# Patient Record
Sex: Female | Born: 1985 | Race: White | Hispanic: No | Marital: Married | Smoking: Current every day smoker
Health system: Southern US, Community
[De-identification: ages and names within clinical notes are randomized; demographics above are authoritative.]

## PROBLEM LIST (undated history)

## (undated) ENCOUNTER — Emergency Department (HOSPITAL_BASED_OUTPATIENT_CLINIC_OR_DEPARTMENT_OTHER): Discharge status: Absent without leave | Payer: Medicaid Other

## (undated) DIAGNOSIS — F419 Anxiety disorder, unspecified: Secondary | ICD-10-CM

## (undated) DIAGNOSIS — E119 Type 2 diabetes mellitus without complications: Secondary | ICD-10-CM

## (undated) DIAGNOSIS — K219 Gastro-esophageal reflux disease without esophagitis: Secondary | ICD-10-CM

## (undated) HISTORY — PX: TUBAL LIGATION: SHX77

## (undated) HISTORY — DX: Anxiety disorder, unspecified: F41.9

## (undated) HISTORY — DX: Gastro-esophageal reflux disease without esophagitis: K21.9

---

## 2003-08-14 ENCOUNTER — Ambulatory Visit (HOSPITAL_COMMUNITY): Admission: AD | Admit: 2003-08-14 | Discharge: 2003-08-14 | Payer: Self-pay | Admitting: Obstetrics and Gynecology

## 2003-09-03 ENCOUNTER — Ambulatory Visit (HOSPITAL_COMMUNITY): Admission: AD | Admit: 2003-09-03 | Discharge: 2003-09-03 | Payer: Self-pay | Admitting: Obstetrics and Gynecology

## 2003-10-05 ENCOUNTER — Ambulatory Visit (HOSPITAL_COMMUNITY): Admission: AD | Admit: 2003-10-05 | Discharge: 2003-10-05 | Payer: Self-pay | Admitting: Obstetrics & Gynecology

## 2003-10-24 ENCOUNTER — Ambulatory Visit (HOSPITAL_COMMUNITY): Admission: AD | Admit: 2003-10-24 | Discharge: 2003-10-24 | Payer: Self-pay | Admitting: Obstetrics & Gynecology

## 2003-10-29 ENCOUNTER — Inpatient Hospital Stay (HOSPITAL_COMMUNITY): Admission: AD | Admit: 2003-10-29 | Discharge: 2003-11-02 | Payer: Self-pay | Admitting: Obstetrics and Gynecology

## 2004-12-23 ENCOUNTER — Inpatient Hospital Stay (HOSPITAL_COMMUNITY): Admission: RE | Admit: 2004-12-23 | Discharge: 2004-12-26 | Payer: Self-pay | Admitting: Obstetrics and Gynecology

## 2008-09-23 ENCOUNTER — Other Ambulatory Visit: Admission: RE | Admit: 2008-09-23 | Discharge: 2008-09-23 | Payer: Self-pay | Admitting: Obstetrics and Gynecology

## 2009-07-07 ENCOUNTER — Other Ambulatory Visit: Admission: RE | Admit: 2009-07-07 | Discharge: 2009-07-07 | Payer: Self-pay | Admitting: Obstetrics and Gynecology

## 2009-12-23 ENCOUNTER — Other Ambulatory Visit: Admission: RE | Admit: 2009-12-23 | Discharge: 2009-12-23 | Payer: Self-pay | Admitting: Obstetrics and Gynecology

## 2010-05-17 ENCOUNTER — Ambulatory Visit (HOSPITAL_COMMUNITY): Admission: RE | Admit: 2010-05-17 | Discharge: 2010-05-17 | Payer: Self-pay | Admitting: Obstetrics & Gynecology

## 2010-07-01 ENCOUNTER — Inpatient Hospital Stay (HOSPITAL_COMMUNITY): Admission: RE | Admit: 2010-07-01 | Discharge: 2010-07-03 | Payer: Self-pay | Admitting: Obstetrics and Gynecology

## 2011-01-05 LAB — TYPE AND SCREEN: ABO/RH(D): A POS

## 2011-01-05 LAB — GLUCOSE, CAPILLARY: Glucose-Capillary: 96 mg/dL (ref 70–99)

## 2011-01-05 LAB — CBC
HCT: 26.9 % — ABNORMAL LOW (ref 36.0–46.0)
Hemoglobin: 11.1 g/dL — ABNORMAL LOW (ref 12.0–15.0)
MCH: 27.9 pg (ref 26.0–34.0)
MCV: 82.3 fL (ref 78.0–100.0)
MCV: 83 fL (ref 78.0–100.0)
Platelets: 158 10*3/uL (ref 150–400)
Platelets: 206 10*3/uL (ref 150–400)
RBC: 3.98 MIL/uL (ref 3.87–5.11)
RDW: 14.8 % (ref 11.5–15.5)

## 2011-01-05 LAB — ABO/RH: ABO/RH(D): A POS

## 2011-01-05 LAB — RPR: RPR Ser Ql: NONREACTIVE

## 2011-03-10 NOTE — H&P (Signed)
NAME:  Carol Escobar, Carol Escobar                        ACCOUNT NO.:  [id number]   MEDICAL RECORD NO.:  [id number]                   PATIENT TYPE:  INP   LOCATION:  LDR1                                 FACILITY:  [facility]   PHYSICIAN:  Tilda Burrow, M.D.              DATE OF BIRTH:  [birth year]   DATE OF ADMISSION:  10/29/2003  DATE OF DISCHARGE:                                HISTORY & PHYSICAL   ADMISSION DIAGNOSIS:  This is a 38-1/2-weeks gestation, elective induction  at the patient's request.   HISTORY OF PRESENT ILLNESS:  This is an 25 year old female, gravida 2, para  0, ab 1, due November 10, 2003 by consensus criteria, followed through our  office through pregnancy with straight forward pregnancy course to date,  with initial visit Mar 05, 2003 and recent cervical changes noted on exams  as far as back as October 12, 2003, is admitted for induction of labor.  Daneesha has been vigorously requesting induction for some time.  Had agreed  earlier to delay until this time.  We have explained in lengthy  conversations the pros and the cons of elective induction with specific  review of potential complications including the small but unavoidable risk  of fetal lung immaturity at 38-1/[redacted] weeks gestation.  The patient has had  multiple opportunities to ask questions and is comfortable proceeding at  this time, acknowledging these inherent risks.  She is aware that all the  usual potential complications of labor can occur with induced deliveries  including labor complications, fetal distress, or need for emergency  interventions.   Koraima indications for requested induction is her complaints of marked  pelvic pressure and discomfort which she finds incapacitating.  Contractions  have been described as intermittent.  Cervical exam was 2-3 cm, 50%, -2 at  the October 12, 2003 exam, confirmed at December 27 and exam tonight at the  time of admission.  The cervix remains 2 cm, 50%, -2, vertex  presentation.  Estimated fetal weight is 7-1/2 pounds.   PAST MEDICAL HISTORY:  Benign.   PAST SURGICAL HISTORY:  Negative.   She reports the pregnancy was a contraceptive failure.  Early first  trimester ultrasounds agree with the menstrual EDC.  Late ultrasound  suggests an earlier [facility] indicating good fetal growth.   ALLERGIES:  CODEINE which causes only nausea and vomiting.   SOCIAL HISTORY:  Single.  Works at [employer].  Lives with her  mother.   OB HISTORY:  Prior OB history is a 16-week IUFD, managed at [facility].   PHYSICAL EXAMINATION:  GENERAL:  Healthy, alert, oriented, Caucasian female  who appears her stated age.  VITAL SIGNS:  Weight 230.  Vital signs stable.  ABDOMEN:  Term size fetus.  Estimated fetal weight 8 pounds.  Cervix 2, 50%,  -2, vertex, palpated.   PLAN:  Cytotec 25 mcg tablets q.4h. up to three doses to establish uterine  contractions.  Pitocin induction in the a.m.  The patient not laboring in  response to Cytotec.   PRENATAL LABS:  Blood type A positive.  Urine drug screen negative.  Hemoglobin 13, hematocrit 39.  Hepatitis, HIV and GC/Chlamydia, RPR are all  negative.  Sickle Dex is negative.  MSA obtained normally 10/7898.     ___________________________________________                                         Tilda Burrow, M.D.   JVF/MEDQ  D:  10/29/2003  T:  10/29/2003  Job:  [id number]   cc:   Francoise Schaumann. Halm, D.O.  [address]  [place]  [place] [address]  Fax: [phone]

## 2011-03-10 NOTE — Op Note (Signed)
NAMEREIGHN, KAPLAN              ACCOUNT NO.:  [id number]   MEDICAL RECORD NO.:  [id number]          PATIENT TYPE:  AMB   LOCATION:  DAY                           FACILITY:  [facility]   PHYSICIAN:  Tilda Burrow, M.D. DATE OF BIRTH:  January 02, 1986   DATE OF PROCEDURE:  12/23/2004  DATE OF DISCHARGE:                                 OPERATIVE REPORT   PREOPERATIVE DIAGNOSIS:  Pregnancy, 38 weeks 6 days. Prior Cesarean section,  now for trial of labor. Abdominal wall laxity. Poorly healed scar.   POSTOPERATIVE DIAGNOSIS:  Pregnancy, 38 weeks 6 days. Prior Cesarean  section, now for trial of labor. Abdominal wall laxity. Poorly healed scar.  Delivered.   PROCEDURE:  Repeat low transverse Cesarean section, wide excision of old  cicatrix.   SURGEON:  Dr. Emelda Fear.   ASSISTANT:  _________________ .   ANESTHESIA:  Spinal.   FINDINGS:  Healthy female infant. Apgars 9 and 9.   DETAILS OF PROCEDURE:  The patient was taken to the operating room and  prepped and draped for low abdominal procedure with Foley catheter in place  and spinal anesthesia tested for efficacy. The old cicatrix was incised with  two inches of skin and underlying fatty tissue. We then proceeded to enter  the abdominal cavity with a transverse lower abdominal incision in the  midline, opening the peritoneal cavity. Bladder flap was relatively high on  the anterior abdominal wall but was avoided. The bladder flap was developed  on the lower uterine segment. Transverse uterine incision made and extended  laterally using index finger traction and fundal pressure used to guide the  vertex through the incision. The patient delivered a healthy female infant.  Apgars 9 and 9. Cared for by Dr. Milinda Cave; see his note dictated elsewhere.  The patient then proceeded to have uterine irrigation. There was lots of  bleeding from the uterine bed after the placenta was delivered, and we  injected 250 mcg of Hemabate into the myometrium to  reduce this continued  oozing, and pressure on the uterus for 3 to 5 minutes was performed.  Hemostasis was much better then, and we proceeded with the lower uterine  segment incision closure with running locking 2-0 chromic. IV Ancef was  administered. Bladder flap was reapproximated using running 2-0 chromic. The  anterior peritoneum closed with a running 2-0 chromic as well. Fascia closed  with 0 Vicryl. Subcu fatty tissues closed with interrupted 2-0 plain, and  staple closure of the skin completed the procedure with a Jackson-Pratt  drain placed in the subcu fatty space.   ADDENDUM:  The patient showed some increased bleeding in the recovery room  and will be monitored for continued blood loss.      JVF/MEDQ  D:  12/23/2004  T:  12/23/2004  Job:  [id number]   cc:   Jeoffrey Massed, MD  [address]  [place]  [place] [address]  Fax: [phone]

## 2011-03-10 NOTE — Op Note (Signed)
NAME:  Carol Escobar, Carol Escobar                        ACCOUNT NO.:  [id number]   MEDICAL RECORD NO.:  [id number]                   PATIENT TYPE:  INP   LOCATION:  A417                                 FACILITY:  [facility]   PHYSICIAN:  Tilda Burrow, M.D.              DATE OF BIRTH:  17-Oct-1986   DATE OF PROCEDURE:  DATE OF DISCHARGE:                                 OPERATIVE REPORT   PREOPERATIVE DIAGNOSIS:  Pregnancy at 39 weeks, medical induction of labor  (elective), uncertain fetal status.   POSTOPERATIVE DIAGNOSIS:  Pregnancy at 39 weeks, medical induction of labor  (elective), uncertain fetal status, delivered.   PROCEDURE:  Primary low transverse cervical cesarean section.   SURGEON:  Tilda Burrow, M.D.   ASSISTANTAmie Critchley, C.S.T., Small, R.N.   ANESTHESIA:  Spinal.   COMPLICATIONS:  None.   FINDINGS:  Healthy female, Apgars 7 and 8, clear amniotic fluid, no malodor,  no evidence of nuchal cord, normal-appearing posterior placenta.   INDICATIONS FOR PROCEDURE:  An 25 year old female gravida 2, para 0 induced  as per patient request with Cytotec preinduction the night before and then  Pitocin begun at 4 a.m.  By 8 a.m., it was obvious that the patient was  developing repetitive deceleration pattern that was nonreassuring for fetal  wellbeing.  Pitocin was discontinued, and monitoring showed an improved  fetal status and the patient was taken for cesarean section.  The cervix  remained essentially minimally changed from the 2 cm at admission.   DESCRIPTION OF PROCEDURE:  The patient was taken to the operating room and  prepped and draped for lower abdominal surgery after spinal anesthesia  induced.   Transverse lower abdominal incision performed using the method of  Pfannenstiel with bladder flap developed on the anterior lower uterine  segment and transverse uterine incision performed extended laterally using  index finger traction, and generous clear amniotic fluid  encountered, the  vacuum extractor placed on the fetal vertex and then fundal pressure used  while guiding the vertex through the incision without difficulty.  Once the  head was delivered, the arms and shoulders were released using the  physician's index finger in the axilla.  We then proceeded with clamping of  the cord, delivery of the infant to Dr. Milford Cage for further care.  See his  notes for details.  Cord blood samples were obtained and placenta delivered  intact, Schultze presentation.  Uterine irrigation with antibiotic solution  was followed by single layer running locking closure of the uterine incision  and Bovie cautery hemostasis of a couple of small veins on the surface of  the lower uterine segment.  2-0 chromic closure of the bladder flap was  followed by irrigation of the abdomen again with antibiotic solution, then  closure of the peritoneum with 2-0 chromic followed by 0 Vicryl closure of  the fascia.  We used a separate closure technique for the  internal and  external oblique in the lateral one-half of the fascial closure.  This was  performed on both sides.   The subcutaneous tissues were irrigated, reapproximated with two interrupted  2-0 plain sutures, and then staple closure of the skin completed the  procedure.  EBL 600.  The infant weighed 8 pounds 5.6 ounces, Apgars 7 and  8, female.      ___________________________________________                                            Tilda Burrow, M.D.   JVF/MEDQ  D:  10/30/2003  T:  10/30/2003  Job:  [id number]   cc:   Francoise Schaumann. Halm, D.O.  [address], [address]  [place]  [place] [address]  Fax: [phone]

## 2011-03-10 NOTE — Discharge Summary (Signed)
NAMEERNESTINA, Escobar              ACCOUNT NO.:  [id number]   MEDICAL RECORD NO.:  [id number]          PATIENT TYPE:  INP   LOCATION:  A420                          FACILITY:  [facility]   PHYSICIAN:  Tilda Burrow, M.D. DATE OF BIRTH:  May 07, 1986   DATE OF ADMISSION:  12/23/2004  DATE OF DISCHARGE:  03/06/2006LH                                 DISCHARGE SUMMARY   ADMITTING DIAGNOSIS:  Pregnancy at 38 weeks and 6 days, prior cesarean  section not for trial of labor.  Abdominal wall laxity with poorly healed  scar in the past.   DISCHARGE DIAGNOSES:  1.  Same, delivered.  2.  Postoperative anemia secondary to intraoperative blood loss.   PROCEDURE:  Repeat low transverse cervical cesarean section, 74.1, radical  excision of skin lesion, partial panniculectomy.   DISCHARGE MEDICATIONS:  1.  Tylox 30 tablets one to two q.6h. p.r.n. pain.  2.  Motrin 800 mg one every eight hours p.r.n. pain.  3.  Prenatal vitamins and iron.   HOSPITAL COURSE:  A 25 year old female with gravida 3, para 1, AB 1 with  pregnancy course followed through our office and now at 38 weeks, 6 days,  admitted for repeat cesarean section.  Prenatal course notable for weight  gain from 209 to 250.  She has an edematous, puffy area above the old C-  section scar, so the plans were to widely excise the area to result in a  more functional abdominal wall.  The patient went to the operating room  December 23, 2004 for repeat C-section, delivering a healthy female infant.  Apgars 9/9.  The patient's blood loss was greater than usual due to uterine  tone and the placental bed being poor.  Estimated blood loss was 750 to 1000  mL.   Blood type was A positive.  Initial hemoglobin 13, hematocrit 40.  Postoperatively, the patient had a stable postoperative course even though  white count was up to 6.8 and hematocrit 20.8.  This was on day #2 after a  7.3 and 22 on day one.   The patient was stable for discharge on December 26, 2004  for followup in one  week at our office for incision check.      JVF/MEDQ  D:  01/11/2005  T:  01/12/2005  Job:  [id number]

## 2011-03-10 NOTE — H&P (Signed)
Carol Escobar, Carol Escobar              ACCOUNT NO.:  [id number]   MEDICAL RECORD NO.:  [id number]          PATIENT TYPE:  AMB   LOCATION:                                FACILITY:  [facility]   PHYSICIAN:  Tilda Burrow, M.D. DATE OF BIRTH:  May 23, 1986   DATE OF ADMISSION:  DATE OF DISCHARGE:  LH                                HISTORY & PHYSICAL   ADMISSION DIAGNOSES:  1.  Pregnancy at 38 weeks 6 days.  2.  Prior cesarean section.  3.  Now for trial of labor.  4.  Abdominal wall laxity with poorly healed scar for wide excision of      cicatrix.   HISTORY OF PRESENT ILLNESS:  This is a 25 year old female, gravida 3, para  1, AB 1, LMP Mar 05, 2004, placing menstrual [facility] January 08, 2005, with  ultrasound-corrected [facility] of December 31, 2004, based on first trimester  ultrasound and December 27, 2004, based on 20-week scan.  She is admitted at 38  weeks 6 days by earliest ultrasound criteria for repeat cesarean section.  Prenatal course has been followed through our office through 10 prenatal  visits with appropriate fundal height growth and with excess weight gain  from 209 to 250.  The patient is admitted for repeat cesarean section.  She  is also noted to have had an edematous area, puffy, at the site of the old C  section scar, so plans are for repeat cesarean section and wide excision of  cicatrix.   PAST MEDICAL HISTORY:  Benign.   PAST SURGICAL HISTORY:  Negative.   ALLERGIES:  CODEINE.   SOCIAL HISTORY:  Single, lives with baby's father.   PHYSICAL EXAMINATION:  VITAL SIGNS:  Height 5 feet 6 inches, weight 250.  Blood pressure 116/62.  HEENT:  Pupils equal, round, and reactive.  NECK:  Supple.  GENERAL:  Large-frame, Caucasian female.  ABDOMEN:  Term size fetus at 38 cm fundal height, vertex presentation.  Fetal heart tones 140.  PELVIC:  Cervix long and closed when last check.   PRENATAL LABORATORY DATA:  Blood type A positive.  Urine drug screen  negative.  Hemoglobin 13,  hematocrit 40.  Hepatitis, HIV, GC, chlamydia, HSV-  2 all negative.  Glucose challenge test 84 mg percent.  Sickle test  negative.   The patient plans to breast feed and bottle supplement.  Plans IUD for  future contraception, and baby will be cared for by Dr. Milford Cage      JVF/MEDQ  D:  12/19/2004  T:  12/19/2004  Job:  [id number]   cc:   Francoise Schaumann. Halford Chessman  Fax: [phone]

## 2011-03-10 NOTE — Discharge Summary (Signed)
[name], Carol Escobar                        ACCOUNT NO.:  [id number]   MEDICAL RECORD NO.:  [id number]                   PATIENT TYPE:  INP   LOCATION:  A417                                 FACILITY:  [facility]   PHYSICIAN:  Tilda Burrow, M.D.              DATE OF BIRTH:  Jul 10, 1986   DATE OF ADMISSION:  10/29/2003  DATE OF DISCHARGE:                                 DISCHARGE SUMMARY   ADMITTING DIAGNOSES:  Pregnancy.  A 38.[redacted] week gestation, elective induction  of labor at patient's request.   DISCHARGE DIAGNOSES:  Pregnancy, 38.[redacted] weeks gestation delivered,  unsuccessful medical induction of labor, uncertain fetal status, post spinal  cephalgia.   PROCEDURE:  October 29, 2003, Cytotec cervical ripening. October 30, 2003,  Pitocin induction of labor. October 30, 2003, primary low transverse Cesarean  section by Jannifer Franklin, M.D. on October 30, 2003.   DISCHARGE MEDICATIONS:  1. Demerol 50 mg one p.o. q.6h. p.r.n. headache #20.  2. Phenergan 25 mg tablets one half to one tablet q.6h. p.r.n. nausea.  3. Excedrin.  4. Darvocet.  5. Increased caffeine intake.   HISTORY:  This 25 year old female, gravida 2, para 0, AB 1, duet November 10, 2003 was admitted due to persistent pelvic discomfort with elective  induction scheduled as described in the admitting history. Past medical  history negative. Surgical history negative. Cervix 2 to 3 cm, 50%, -2,  vertex. Estimated fetal weight 8 pounds. Cervix 2, 50%, -2.   HOSPITAL COURSE:  The patient was admitted and underwent Cervidil cervical  ripening to improve contraction pattern. She was begun on Pitocin early in  the morning at 4 a.m. on October 30, 2003, developed a pattern showing  nonreassuring repetitive variable decelerations while cervix remained 3 cm.  Some of these were in late position but had characteristic variable  appearance. The patient did not loose beat to beat variability during this  process. Options were discussed  with the patient who requested and agreed  with primary Cesarean delivery. The patient was taken to the OR at 8:57 a.m.  on October 30, 2003, delivering a viable female infant at 9:25 a.m.  Postpartum, the patient remained stable. Had postoperative hemoglobin of  9.7, hematocrit 21.9 and compared to, with admission blood gases showing  hemoglobin 7.28, pCO2 61, pCO2 14. The patient had an uneventful  postoperative course and was stable for discharge on November 02, 2003.  Postpartum course was notable for bilateral cephalohematoma on the infant,  possibly related to the delivery itself and bony dystocia or to unusual  pressure from the back of the sacrum.  She considered postoperative blood patch due to spinal headache but decided  on the day of discharge to try ot manage it without resorting to biochemical  means. She will follow up in one week in our office. Will continue limited  activity. Staple removal in one week.    ___________________________________________  Tilda Burrow, M.D.   JVF/MEDQ  D:  11/02/2003  T:  11/02/2003  Job:  [id number]

## 2011-10-02 IMAGING — US US ABDOMEN COMPLETE
1 series · 14 of 25 positions shown · non-contrast
Comparison: None.

CLINICAL DATA: Right upper quadrant pain.  Pregnant patient (32
weeks gestation).

COMPLETE ABDOMINAL ULTRASOUND

[Series 1: us abdomen complete · 0.30mm/px · 14 of 70 slices shown]
[im 1/70]
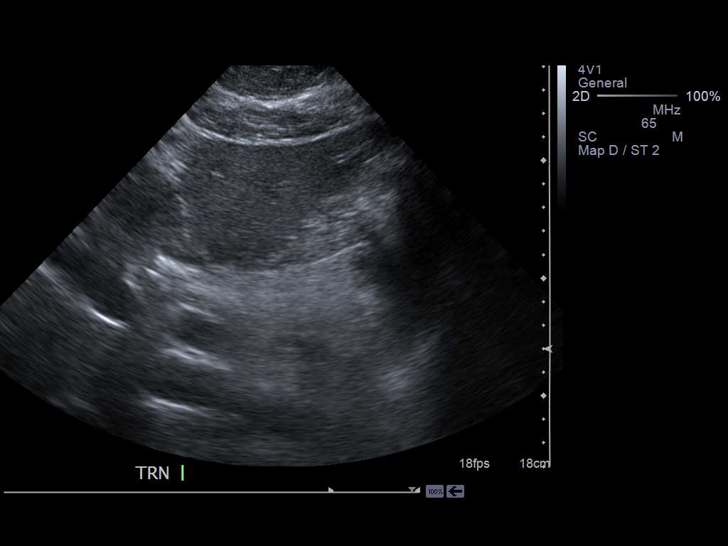
[im 6/70]
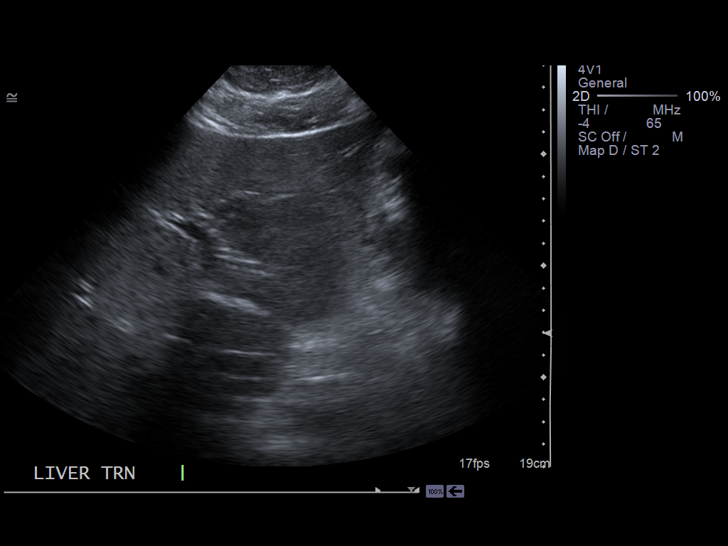
[im 12/70]
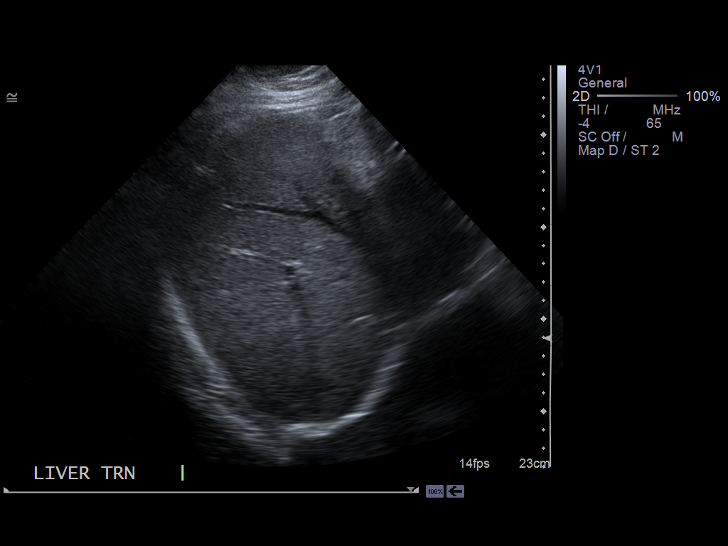
[im 18/70]
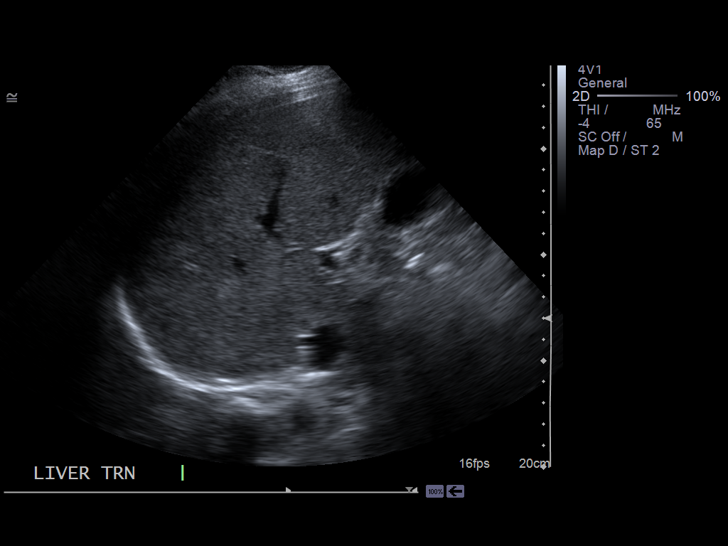
[im 24/70]
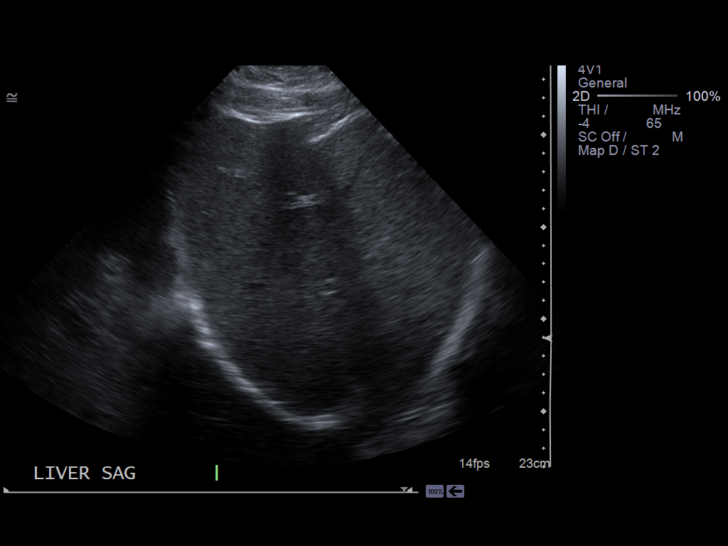
[im 26/70]
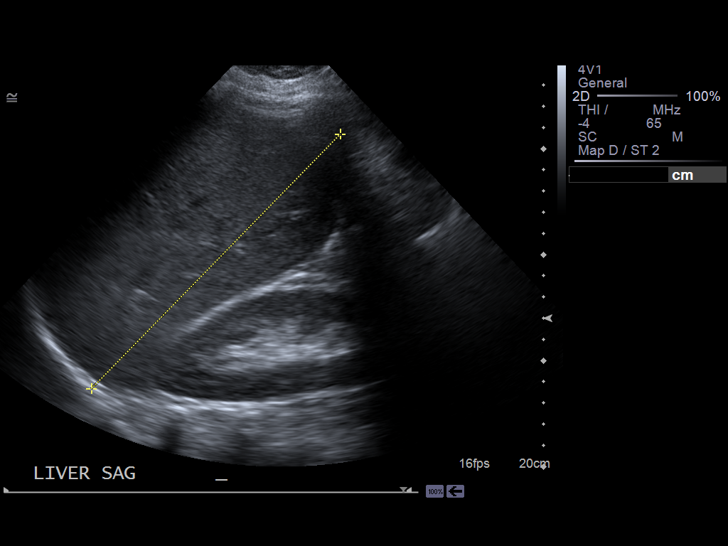
[im 32/70]
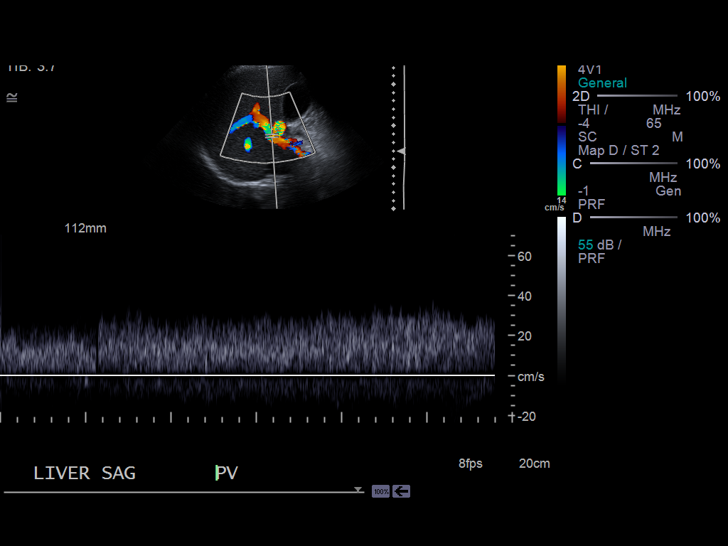
[im 38/70]
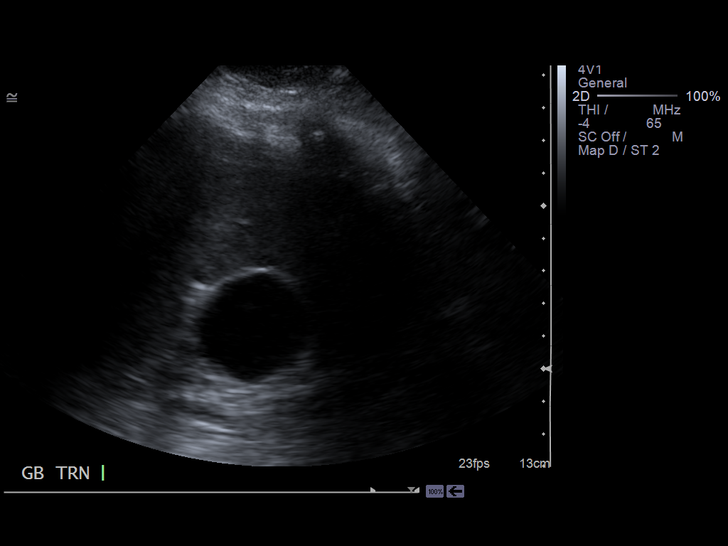
[im 44/70]
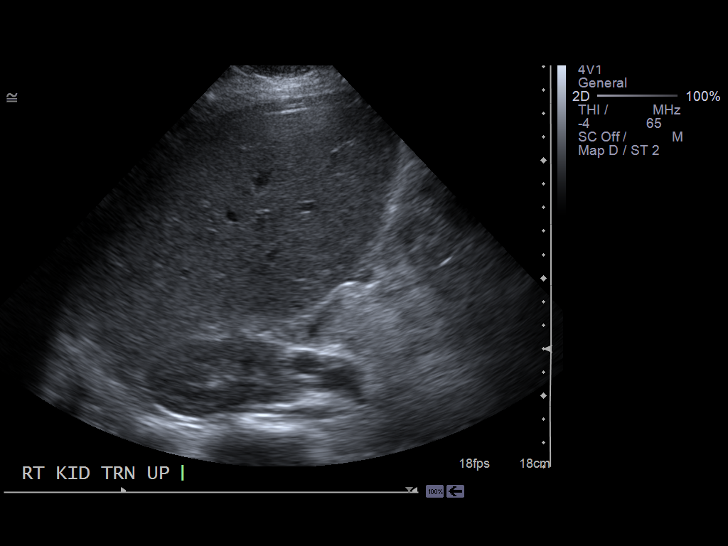
[im 47/70]
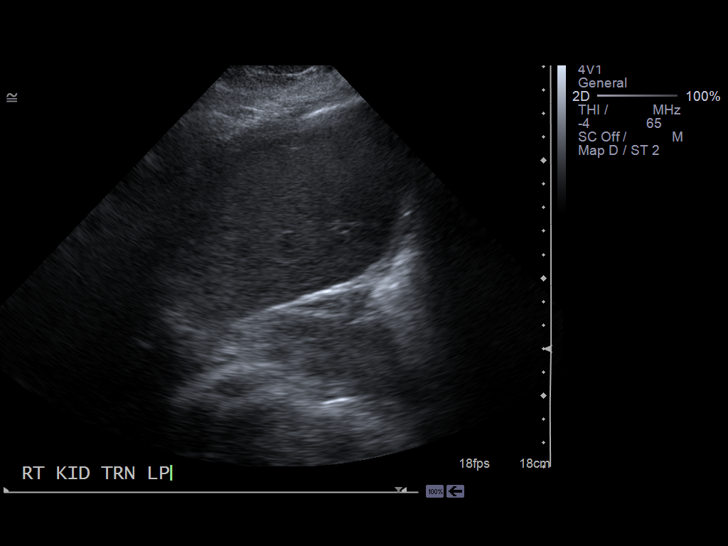
[im 52/70]
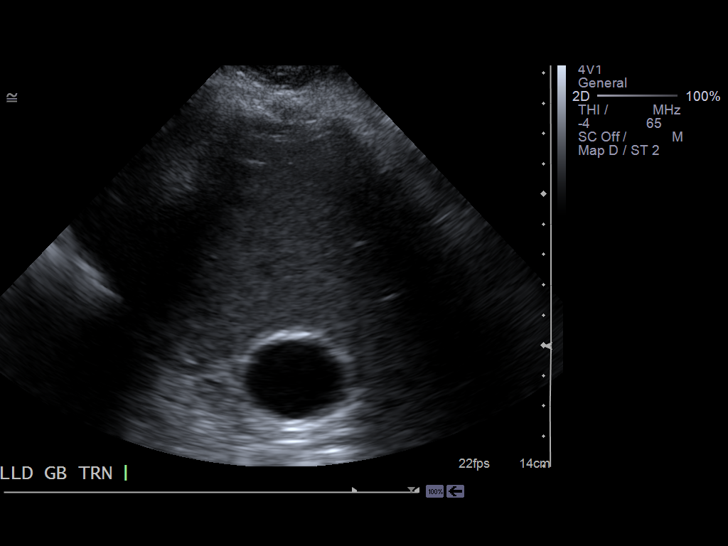
[im 58/70]
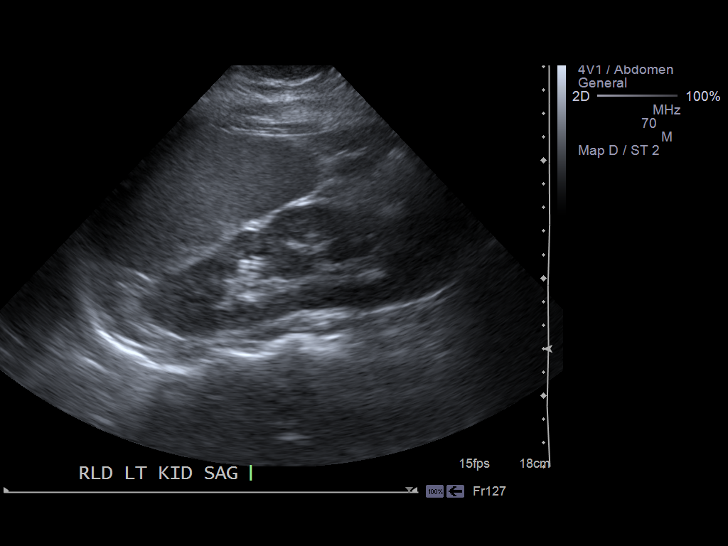
[im 64/70]
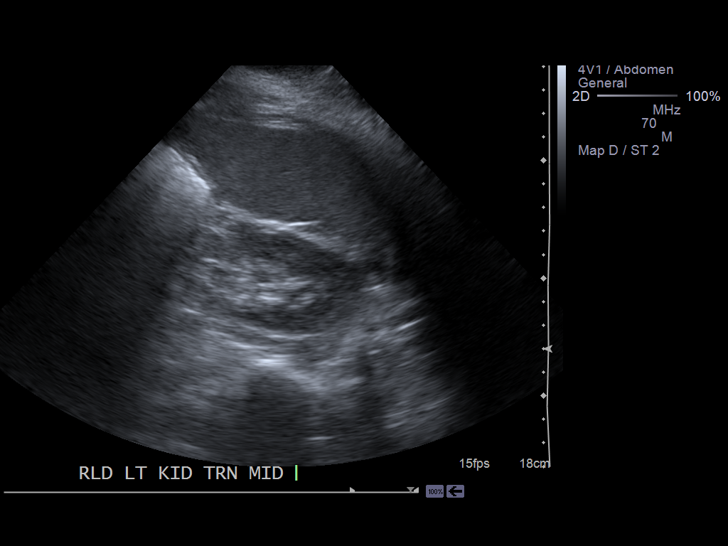
[im 70/70]
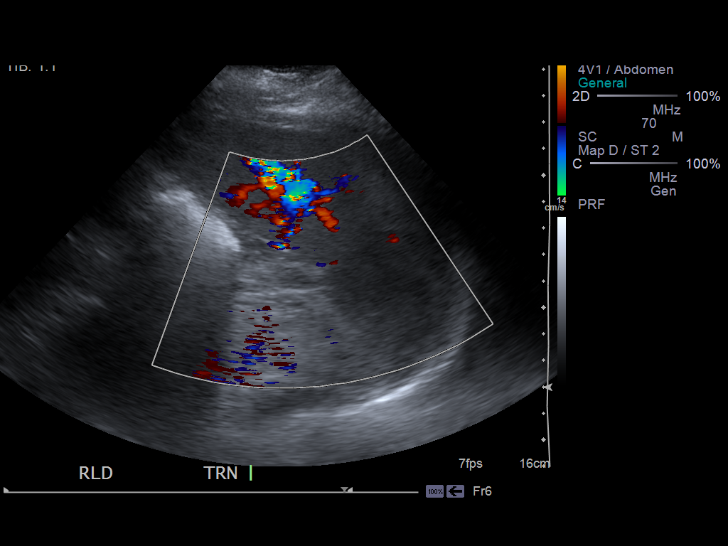

[14 of 25 positions shown; findings below may reference images not displayed]

FINDINGS: Gallbladder:  No gallstones, gallbladder wall thickening, or
pericholecystic fluid. Also, there is no tenderness to transducer
palpation of the gallbladder.

Common bile duct:  Normal appearance measuring 5 mm in diameter.

Liver:  No focal lesion identified.  Within normal limits in
parenchymal echogenicity.

IVC:  Appears normal.

Pancreas:  There is limited visualization of the pancreas due to
overlying bowel gas.  Portions of the head and body of the pancreas
have a normal appearance.  The tail of the pancreas is not
visualized.

Spleen:  Normal in appearance measuring 11.0 cm in length.

Right Kidney:  Normal appearance.  Measures 12.3 cm in length.

Left Kidney:  Normal appearance measuring 13.5 cm and line.

Abdominal aorta:  Normal caliber.
IMPRESSION: Normal study with suboptimal visualization of the entirety of the
pancreas.

## 2016-03-10 LAB — TSH: TSH: 1.09 u[IU]/mL (ref <=5.90)

## 2016-03-10 LAB — HEMOGLOBIN A1C: HEMOGLOBIN A1C: 5.8

## 2016-05-04 ENCOUNTER — Encounter: Payer: Self-pay | Admitting: "Endocrinology

## 2016-05-04 ENCOUNTER — Ambulatory Visit (INDEPENDENT_AMBULATORY_CARE_PROVIDER_SITE_OTHER): Payer: Medicaid Other | Admitting: "Endocrinology

## 2016-05-04 VITALS — BP 113/79 | HR 85 | Ht 66.5 in | Wt 260.0 lb

## 2016-05-04 DIAGNOSIS — R5382 Chronic fatigue, unspecified: Secondary | ICD-10-CM | POA: Diagnosis not present

## 2016-05-04 DIAGNOSIS — R7303 Prediabetes: Secondary | ICD-10-CM | POA: Diagnosis not present

## 2016-05-04 NOTE — Progress Notes (Signed)
Subjective:    Patient ID: Carol Escobar, female    DOB: [birth year], PCP Manon Hilding, MD   Past Medical History  Diagnosis Date  . Anxiety   . GERD (gastroesophageal reflux disease)    Past Surgical History  Procedure Laterality Date  . Cesarean section    . Tubal ligation     Social History   Social History  . Marital Status: Married    Spouse Name: N/A  . Number of Children: N/A  . Years of Education: N/A   Social History Main Topics  . Smoking status: Former Smoker    Quit date: 05/05/2015  . Smokeless tobacco: None  . Alcohol Use: None  . Drug Use: None  . Sexual Activity: Not Asked   Other Topics Concern  . None   Social History Narrative   Outpatient Encounter Prescriptions as of 05/04/2016  Medication Sig  . citalopram (CELEXA) 20 MG tablet Take 20 mg by mouth daily.  . ranitidine (ZANTAC) 150 MG tablet Take 150 mg by mouth 2 (two) times daily.  Marland Kitchen topiramate (TOPAMAX) 25 MG tablet Take 25 mg by mouth at bedtime.  . [DISCONTINUED] Norethin-Eth Estrad-Fe Biphas (LO LOESTRIN FE PO) Take by mouth daily.   No facility-administered encounter medications on file as of 05/04/2016.   ALLERGIES: Allergies  Allergen Reactions  . Codeine    VACCINATION STATUS:  There is no immunization history on file for this patient.  HPI  30 year old female patient with medical history as above. She is being seen in consultation for weight gain requested by Dr. Quintin Alto. -She explained relatively rapid weight gain from 220 pounds 5 years ago to current level of 260 pounds. She admits to significant dietary indiscretions including consumption of large crowds of processed carbohydrates. She was never diagnosed with diabetes however her most recent labs showed A1c of 5.8% consistent with prediabetes. She underwent 24-hour urine free cortisol which was normal. -She has family history of diabetes in her mother as well as siblings. -She has not tried any commercial nor  individual weight loss programs. - When asked directly she admits to snoring at night. She complains of fatigue/depression/anxiety. She also has history of migraine for which she was initiated on Topamax 25 mg daily. -She denies family history of adrenal/pituitary dysfunction.  Review of Systems Constitutional: +weight gain, + fatigue, no subjective hyperthermia/hypothermia Eyes: no blurry vision, no xerophthalmia ENT: no sore throat, no nodules palpated in throat, no dysphagia/odynophagia, no hoarseness Cardiovascular: no CP/SOB/palpitations/leg swelling Respiratory: no cough/SOB Gastrointestinal: no N/V/D/C Musculoskeletal: no muscle/joint aches Skin: no rashes Neurological: no tremors/numbness/tingling/dizziness Psychiatric: + depression/anxiety  Objective:    BP 113/79 mmHg  Pulse 85  Ht 5' 6.5" (1.689 m)  Wt 260 lb (117.935 kg)  BMI 41.34 kg/m2  Wt Readings from Last 3 Encounters:  05/04/16 260 lb (117.935 kg)    Physical Exam   Constitutional: obese, in NAD Eyes: PERRLA, EOMI, no exophthalmos ENT: moist mucous membranes, no thyromegaly, no cervical lymphadenopathy Cardiovascular: RRR, No MRG Respiratory: CTA B Gastrointestinal: abdomen soft, NT, ND, BS+ Musculoskeletal: she has dorsal cervical fat pad, she has no extremity  deformity strength intact in all 4 Skin: moist, warm, no rashes, diffuse tattoos. She has purplish striae on abdomen. Neurological: no tremor with outstretched hands, DTR normal in all 4  Recent Results (from the past 2160 hour(s))  Hemoglobin A1c     Status: None   Collection Time: 03/10/16 12:00 AM  Result Value Ref Range   Hemoglobin  A1C 5.8     Comment: She has had 24 hour urine free cortisol on her 03/16/2016 18  TSH     Status: None   Collection Time: 03/10/16 12:00 AM  Result Value Ref Range   TSH 1.09 .41 - 5.90 uIU/mL   24-hour urine free cortisol on a 25 2017 was 18 (normal 0-50)  Assessment & Plan:   1. Chronic fatigue - I  have reviewed her referral package and examined patient clinically. -The workup so far does not indicate Cushing's syndrome/disease. - Given her history of snoring, it is likely that she has sleep apnea. - Common things comment, she seems to have excess calorie associated weight gain. -She weighed 220 pounds 5 years ago, 245 pounds a year ago, 260 pounds today. It appears that she is having a relatively rapid weight gain however she admits to a number of dietary indiscretions. See history of present illness. I have spent 30 minutes of time explaining to her the benefit of low carbs/ more protein diet to help her lose weight. This will in turn improve her sleep apnea/prediabetes. -I gave her individualized meal/exercise plan.  2. Prediabetes - Her A1c is 5.8%, she would not need medication today. See above 1.   3. Morbid obesity due to excess calories ([place]) -See #1 and 2 above - She will benefit from a dietitian consult hence I will request him. - If her insurance cooperates, she will be considered for weight loss medications and/or bariatric surgery.   - I advised patient to maintain close follow up with Manon Hilding, MD for primary care needs. Follow up plan: Return in about 8 weeks (around 06/29/2016) for follow up with pre-visit labs.  Glade Lloyd, MD Phone: [phone]  Fax: [phone]   05/04/2016, 4:56 PM

## 2016-05-04 NOTE — Patient Instructions (Signed)

## 2016-05-15 ENCOUNTER — Encounter: Payer: Self-pay | Admitting: Family Medicine

## 2016-07-05 ENCOUNTER — Ambulatory Visit: Payer: Medicaid Other | Admitting: "Endocrinology

## 2018-02-13 ENCOUNTER — Ambulatory Visit (INDEPENDENT_AMBULATORY_CARE_PROVIDER_SITE_OTHER): Payer: Self-pay | Admitting: Orthopaedic Surgery

## 2019-12-04 LAB — BASIC METABOLIC PANEL
BUN: 10 (ref 4–21)
Creatinine: 0.8 (ref 0.5–1.1)

## 2019-12-05 LAB — HEMOGLOBIN A1C: Hemoglobin A1C: 6.3

## 2019-12-05 LAB — LIPID PANEL
Cholesterol: 255 — AB (ref 0–200)
HDL: 49 (ref 35–70)
LDL Cholesterol: 153
Triglycerides: 290 — AB (ref 40–160)

## 2020-03-17 ENCOUNTER — Ambulatory Visit (INDEPENDENT_AMBULATORY_CARE_PROVIDER_SITE_OTHER): Payer: Medicaid Other | Admitting: "Endocrinology

## 2020-03-17 ENCOUNTER — Other Ambulatory Visit: Payer: Self-pay

## 2020-03-17 ENCOUNTER — Encounter: Payer: Self-pay | Admitting: "Endocrinology

## 2020-03-17 DIAGNOSIS — E119 Type 2 diabetes mellitus without complications: Secondary | ICD-10-CM | POA: Diagnosis not present

## 2020-03-17 DIAGNOSIS — E782 Mixed hyperlipidemia: Secondary | ICD-10-CM | POA: Diagnosis not present

## 2020-03-17 DIAGNOSIS — F17200 Nicotine dependence, unspecified, uncomplicated: Secondary | ICD-10-CM | POA: Diagnosis not present

## 2020-03-17 DIAGNOSIS — R635 Abnormal weight gain: Secondary | ICD-10-CM | POA: Insufficient documentation

## 2020-03-17 NOTE — Progress Notes (Signed)
Endocrinology Consult Note                                            03/17/2020, 7:03 PM   Subjective:    Patient ID: Carol Escobar, female    DOB: Mar 16, 1986, PCP Sasser, Silvestre Moment, MD   Past Medical History:  Diagnosis Date  . Anxiety   . GERD (gastroesophageal reflux disease)    Past Surgical History:  Procedure Laterality Date  . CESAREAN SECTION    . TUBAL LIGATION     Social History   Socioeconomic History  . Marital status: Married    Spouse name: Not on file  . Number of children: Not on file  . Years of education: Not on file  . Highest education level: Not on file  Occupational History  . Not on file  Tobacco Use  . Smoking status: Current Every Day Smoker    Last attempt to quit: 05/05/2015    Years since quitting: 4.8  Substance and Sexual Activity  . Alcohol use: Not on file  . Drug use: Not on file  . Sexual activity: Not on file  Other Topics Concern  . Not on file  Social History Narrative  . Not on file   Social Determinants of Health   Financial Resource Strain:   . Difficulty of Paying Living Expenses:   Food Insecurity:   . Worried About Charity fundraiser in the Last Year:   . Arboriculturist in the Last Year:   Transportation Needs:   . Film/video editor (Medical):   Marland Kitchen Lack of Transportation (Non-Medical):   Physical Activity:   . Days of Exercise per Week:   . Minutes of Exercise per Session:   Stress:   . Feeling of Stress :   Social Connections:   . Frequency of Communication with Friends and Family:   . Frequency of Social Gatherings with Friends and Family:   . Attends Religious Services:   . Active Member of Clubs or Organizations:   . Attends Archivist Meetings:   Marland Kitchen Marital Status:    Family History  Problem Relation Age of Onset  . Diabetes Mother   . Thyroid disease Mother   . Hyperlipidemia Mother   . Heart attack Mother   . Diabetes Father   . Hyperlipidemia Father   . Heart attack  Father   . Heart failure Father    Outpatient Encounter Medications as of 03/17/2020  Medication Sig  . rosuvastatin (CRESTOR) 20 MG tablet Take 20 mg by mouth daily.  . [DISCONTINUED] citalopram (CELEXA) 20 MG tablet Take 20 mg by mouth daily.  . [DISCONTINUED] ranitidine (ZANTAC) 150 MG tablet Take 150 mg by mouth 2 (two) times daily.  . [DISCONTINUED] topiramate (TOPAMAX) 25 MG tablet Take 25 mg by mouth at bedtime.   No facility-administered encounter medications on file as of 03/17/2020.   ALLERGIES: Allergies  Allergen Reactions  . Codeine     VACCINATION STATUS:  There is no immunization history on file for this patient.  HPI Carol Escobar is 34 y.o. female who presents today with a medical history as above. she is being seen in consultation for evaluation for Cushing's syndrome/disease requested by Manon Hilding, MD.   This patient was seen in 2017 for chronic fatigue and weight management.  She was worked up with 24-hour urine free cortisol which was normal ruling out Cushing syndrome.  With a diagnosis of obesity related to excessive caloric intake she was given education on diet and exercise, did not return for follow-up.  At that time she weighed 260 pounds.  Over the subsequent 4 years, she continued to gain weight more slowly, currently 275 pounds.  She is being rereferred to be evaluated for Cushing syndrome.  -She reports that lately she has made significant changes in her diet, however cannot lose weight.  She did not have recent lab work for adrenal function.  She has family history of type 2 diabetes, was herself diagnosed with diabetes.  She did not tolerate Metformin, her most recent A1c was 6.3% on December 04, 2019.  Review of Systems  Constitutional: +recent weight gain, + fatigue, no subjective hyperthermia, no subjective hypothermia Eyes: no blurry vision, no xerophthalmia ENT: no sore throat, no nodules palpated in throat, no dysphagia/odynophagia, no  hoarseness Cardiovascular: no Chest Pain, no Shortness of Breath, no palpitations, no leg swelling Respiratory: no cough, no shortness of breath Gastrointestinal: no Nausea/Vomiting/Diarhhea Musculoskeletal: no muscle/joint aches Skin: no rashes Neurological: no tremors, no numbness, no tingling, no dizziness Psychiatric: no depression, no anxiety  Objective:    Vitals with BMI 03/17/2020 05/04/2016  Height 5' 5.5" 5' 6.5"  Weight 275 lbs 13 oz 260 lbs  BMI 0000000 123456  Systolic [id number]  Diastolic 74 79  Pulse 87 85    BP 110/74   Pulse 87   Ht 5' 5.5" (1.664 m)   Wt 275 lb 12.8 oz (125.1 kg)   BMI 45.20 kg/m   Wt Readings from Last 3 Encounters:  03/17/20 275 lb 12.8 oz (125.1 kg)  05/04/16 260 lb (117.9 kg)    Physical Exam  Constitutional:  Body mass index is 45.2 kg/m.,  not in acute distress, normal state of mind Eyes: PERRLA, EOMI, no exophthalmos ENT: moist mucous membranes, no gross thyromegaly, no gross cervical lymphadenopathy, + tender dorsal cervical fat pad. Cardiovascular: normal precordial activity, Regular Rate and Rhythm, no Murmur/Rubs/Gallops Respiratory:  adequate breathing efforts, no gross chest deformity, Clear to auscultation bilaterally Gastrointestinal: abdomen soft, Non -tender, No distension, Bowel Sounds present, no gross organomegaly Musculoskeletal: no gross deformities, strength intact in all four extremities Skin: moist, warm, no rashes, + striae which are not palpable.  She is a mother of 3 children. Neurological: no tremor with outstretched hands, Deep tendon reflexes normal in bilateral lower extremities.   Diabetic Labs (most recent): Lab Results  Component Value Date   HGBA1C 5.8 03/10/2016     Lab Results  Component Value Date   TSH 1.09 03/10/2016    Lipid Panel     Component Value Date/Time   CHOL 255 (A) 12/04/2019 0000   TRIG 290 (A) 12/04/2019 0000   HDL 49 12/04/2019 0000   LDLCALC 153 12/04/2019 0000    High  A1c December 04, 2019 was 6.3%.   Assessment & Plan:   1. Weight gain 2.  Type 2 diabetes 3.dyslipidemia  - Marriana R Brevik  is being seen at a kind request of Sasser, Silvestre Moment, MD. - I have reviewed her available endocrine records and clinically evaluated the patient. - Based on these reviews, she has morbid obesity, type 2 diabetes, dyslipidemia,  however,  there is not specific work-up for Cushing syndrome.    -This patient was seen for weight concern in 2017 when she weighed 260 pounds, currently  weighs 275 pounds. -Her weight is likely related to excessive caloric intake, unlikely to be due to Cushing's disease/syndrome.  However,she will benefit from work-up.  I discussed with her diagnosis of Cushing's syndrome/disease will need multiple positive tests to confirm. We will start with 24-hour urine free cortisol measurement.  She will also have thyroid function tests.   Regarding her concurrent weight concern and history of type 2 diabetes, she would benefit the most from weight loss. - she  admits there is a room for improvement in her diet and drink choices. -  Suggestion is made for her to avoid simple carbohydrates  from her diet including Cakes, Sweet Desserts / Pastries, Ice Cream, Soda (diet and regular), Sweet Tea, Candies, Chips, Cookies, Sweet Pastries,  Store Bought Juices, Alcohol in Excess of  1-2 drinks a day, Artificial Sweeteners, Coffee Creamer, and "Sugar-free" Products. This will help patient avoid potentially avoid unintended weight gain. -Suitable  exercise regimen is also discussed with her. -She will be considered for one of the recently approved weight loss medications if underlying endocrine disorders are ruled out.   -She has surgery as a last option for weight loss.  -She is advised to continue Crestor 20 mg p.o. nightly for dyslipidemia.  - I did not initiate any new prescriptions today. - she is advised to maintain close follow up with Sasser, Silvestre Moment, MD for  primary care needs.   - Time spent with the patient: 60 minutes, of which >50% was spent in  counseling her about her weight management, discussion of Cushing syndrome, dyslipidemia and the rest in obtaining information about her symptoms, reviewing her previous labs/studies ( including abstractions from other facilities),  evaluations, and treatments,  and developing a plan to confirm diagnosis and long term treatment based on the latest standards of care/guidelines; and documenting her care.  Lavonne Chick Morden participated in the discussions, expressed understanding, and voiced agreement with the above plans.  All questions were answered to her satisfaction. she is encouraged to contact clinic should she have any questions or concerns prior to her return visit.  Follow up plan: Return for Labs Today- Non-Fasting Ok, Ahmeek, MD [facility] [address] [place], [place] [address] Phone: [phone]  Fax: [phone]     03/17/2020, 7:03 PM  This note was partially dictated with voice recognition software. Similar sounding words can be transcribed inadequately or may not  be corrected upon review.

## 2020-03-17 NOTE — Patient Instructions (Signed)

## 2020-04-01 ENCOUNTER — Ambulatory Visit: Payer: Medicaid Other | Admitting: "Endocrinology

## 2020-04-14 ENCOUNTER — Ambulatory Visit (INDEPENDENT_AMBULATORY_CARE_PROVIDER_SITE_OTHER): Payer: Medicaid Other | Admitting: Gastroenterology

## 2020-04-14 ENCOUNTER — Encounter (INDEPENDENT_AMBULATORY_CARE_PROVIDER_SITE_OTHER): Payer: Self-pay | Admitting: Gastroenterology

## 2020-04-14 ENCOUNTER — Other Ambulatory Visit: Payer: Self-pay

## 2020-04-14 VITALS — BP 135/86 | HR 94 | Temp 98.5°F | Ht 65.5 in | Wt 273.0 lb

## 2020-04-14 DIAGNOSIS — K219 Gastro-esophageal reflux disease without esophagitis: Secondary | ICD-10-CM | POA: Diagnosis not present

## 2020-04-14 DIAGNOSIS — R1013 Epigastric pain: Secondary | ICD-10-CM | POA: Diagnosis not present

## 2020-04-14 MED ORDER — PANTOPRAZOLE SODIUM 40 MG PO TBEC
40.0000 mg | DELAYED_RELEASE_TABLET | Freq: Every day | ORAL | 3 refills | Status: DC
Start: 2020-04-14 — End: 2020-12-13

## 2020-04-14 NOTE — Progress Notes (Signed)
Patient profile: Carol Escobar is a 34 y.o. female seen for evaluation of epigastric pain.  History of Present Illness: Carol Escobar is seen today for upper abd pain (ongoing about a year) - bilateral upper abd, sometimes worse on right side. Most notable post prandially to all spices, shrimp, butter, rice at [employer], salsa, and variety of other foods - she used to eat these w/o difficulty but now has to be careful what she eats. Pain begins immedatiately after eating. Alcohol makes pain worse (very rare intake). Episodes can last 3-4 hours then pain will resolve and feels okay between episodes.  She reports episodes are becoming more frequent recently.  She does have some chronic GERD symptoms as well and takes Tums nightly. Reports waking up with bad taste in mouth and sometimes waking up w/ volume regurgitation. Previously was on rantitidine but not currently on anything routine except Tums before bed. No nausea/vomiting. No frequent dysphagia.   Denies nsaid use. Smokes 1 PPD. Very rare alcohol.   Having a small stool daily but doesn't feel completely empty. When has a BM it is normal consistency. Had 2 days of black stool last month but this resolved (unsure if used pepto/iron prior, does states she uses Pepto as needed for upper abdominal pain sometimes.). No brb in stool.   Wt Readings from Last 3 Encounters:  04/14/20 273 lb (123.8 kg)  03/17/20 275 lb 12.8 oz (125.1 kg)  05/04/16 260 lb (117.9 kg)     Last Colonoscopy: None prior Last Endoscopy: None prior   Past Medical History:  Past Medical History:  Diagnosis Date  . Anxiety   . GERD (gastroesophageal reflux disease)     Problem List: Patient Active Problem List   Diagnosis Date Noted  . Weight gain 03/17/2020  . Type 2 diabetes, HbA1c goal < 7% ([place]) 03/17/2020  . Mixed hyperlipidemia 03/17/2020  . Current smoker 03/17/2020  . Chronic fatigue 05/04/2016  . Prediabetes 05/04/2016  . Morbid obesity  due to excess calories ([place]) 05/04/2016    Past Surgical History: Past Surgical History:  Procedure Laterality Date  . CESAREAN SECTION    . TUBAL LIGATION      Allergies: Allergies  Allergen Reactions  . Codeine       Home Medications:  Current Outpatient Medications:  .  calcium carbonate (TUMS CHEWY BITES) 750 MG chewable tablet, Chew 1 tablet by mouth as needed for heartburn., Disp: , Rfl:  .  montelukast (SINGULAIR) 10 MG tablet, Take 10 mg by mouth at bedtime., Disp: , Rfl:  .  rosuvastatin (CRESTOR) 20 MG tablet, Take 20 mg by mouth daily., Disp: , Rfl:  .  pantoprazole (PROTONIX) 40 MG tablet, Take 1 tablet (40 mg total) by mouth daily., Disp: 30 tablet, Rfl: 3   Family History: family history includes Diabetes in her father and mother; Heart attack in her father and mother; Heart failure in her father; Hyperlipidemia in her father and mother; Thyroid disease in her mother.   Mother-diverticulitis  Father - had CCY for gallstones  Mom's sister - pancreatic cancer   Social History:   reports that she has been smoking. She has never used smokeless tobacco. She reports that she does not drink alcohol and does not use drugs.   Review of Systems: Constitutional: Denies weight loss/weight gain  Eyes: No changes in vision. ENT: No oral lesions, sore throat.  GI: see HPI.  Heme/Lymph: No easy bruising.  CV: No chest pain.  GU:  No hematuria.  Integumentary: No rashes.  Neuro: No headaches.  Psych: No depression/anxiety.  Endocrine: No heat/cold intolerance.  Allergic/Immunologic: No urticaria.  Resp: No cough, SOB.  Musculoskeletal: No joint swelling.    Physical Examination: BP 135/86 (BP Location: Right Arm, Patient Position: Sitting, Cuff Size: Large)   Pulse 94   Temp 98.5 F (36.9 C) (Oral)   Ht 5' 5.5" (1.664 m)   Wt 273 lb (123.8 kg)   LMP 03/05/2020 Comment: Patient states that her periods are irregular  BMI 44.74 kg/m  Gen: NAD, alert and oriented x  4 HEENT: PEERLA, EOMI, Neck: supple, no JVD Chest: CTA bilaterally, no wheezes, crackles, or other adventitious sounds CV: RRR, no m/g/c/r Abd: soft, mild TTP bilateral upper abd, +BS in all four quadrants; no HSM, guarding, ridigity, or rebound tenderness Ext: no edema, well perfused with 2+ pulses, Skin: no rash or lesions noted on observed skin Lymph: no noted LAD  Data Reviewed:  Per patient she has had an ultrasound a few years ago with fatty liver.  No recent labs in past year.   Assessment/Plan: Carol Escobar is a 34 y.o. female seen for evaluation abdominal pain  1. Epigastric/right upper quadrant pain-intermittent episodes postprandially w/ associated chronic GERD. Differential would include biliary, peptic ulcer disease, gastritis etc. Will check labs, hpylori, and schedule ultrasound. We will also empirically start PPI. She will keep track of her symptoms frequency triggers, etc. if symptoms do not improve would recommend endoscopy as well to evaluate.   She will follow up in office 4 to 6 weeks. Further recs pending lab/US results.   2.  Constipation-mild-patient has tried fiber supplement, she will try small dose of MiraLAX daily.  Carol Escobar was seen today for new patient (initial visit).  Diagnoses and all orders for this visit:  Abdominal pain, epigastric -     US Abdomen Complete; Future -     COMPLETE METABOLIC PANEL WITH GFR -     Lipase -     CBC with Differential -     Helicobacter pylori special antigen  Chronic GERD -     US Abdomen Complete; Future -     COMPLETE METABOLIC PANEL WITH GFR -     Lipase -     CBC with Differential -     Helicobacter pylori special antigen  Other orders -     pantoprazole (PROTONIX) 40 MG tablet; Take 1 tablet (40 mg total) by mouth daily.       I personally performed the service, non-incident to. (WP)  Laurine Blazer, [facility]

## 2020-04-14 NOTE — Patient Instructions (Addendum)
Take protonix 30 min before breakfast daily. We are ordering ultrasound and labs for evaluation.

## 2020-04-20 ENCOUNTER — Telehealth (INDEPENDENT_AMBULATORY_CARE_PROVIDER_SITE_OTHER): Payer: Self-pay | Admitting: *Deleted

## 2020-04-20 ENCOUNTER — Other Ambulatory Visit: Payer: Self-pay

## 2020-04-20 ENCOUNTER — Ambulatory Visit (HOSPITAL_COMMUNITY)
Admission: RE | Admit: 2020-04-20 | Discharge: 2020-04-20 | Disposition: A | Discharge status: Home / Self Care | Payer: Medicaid Other | Source: Ambulatory Visit | Attending: Gastroenterology | Admitting: Gastroenterology

## 2020-04-20 DIAGNOSIS — K219 Gastro-esophageal reflux disease without esophagitis: Secondary | ICD-10-CM | POA: Insufficient documentation

## 2020-04-20 DIAGNOSIS — R1013 Epigastric pain: Secondary | ICD-10-CM | POA: Insufficient documentation

## 2020-04-20 LAB — COMPLETE METABOLIC PANEL WITH GFR
AG Ratio: 1.8 (calc) (ref 1.0–2.5)
ALT: 130 U/L — ABNORMAL HIGH (ref 6–29)
AST: 79 U/L — ABNORMAL HIGH (ref 10–30)
Albumin: 3.9 g/dL (ref 3.6–5.1)
Alkaline phosphatase (APISO): 44 U/L (ref 31–125)
BUN: 8 mg/dL (ref 7–25)
CO2: 26 mmol/L (ref 20–32)
Calcium: 9.1 mg/dL (ref 8.6–10.2)
Chloride: 104 mmol/L (ref 98–110)
Creat: 0.7 mg/dL (ref 0.50–1.10)
GFR, Est African American: 130 mL/min/{1.73_m2} (ref >=60)
GFR, Est Non African American: 112 mL/min/{1.73_m2} (ref >=60)
Globulin: 2.2 g/dL (calc) (ref 1.9–3.7)
Glucose, Bld: 204 mg/dL — ABNORMAL HIGH (ref 65–139)
Potassium: 3.8 mmol/L (ref 3.5–5.3)
Sodium: 136 mmol/L (ref 135–146)
Total Bilirubin: 0.8 mg/dL (ref 0.2–1.2)
Total Protein: 6.1 g/dL (ref 6.1–8.1)

## 2020-04-20 LAB — CBC WITH DIFFERENTIAL/PLATELET
Absolute Monocytes: 508 cells/uL (ref 200–950)
Basophils Absolute: 31 cells/uL (ref 0–200)
Basophils Relative: 0.5 %
Eosinophils Absolute: 248 cells/uL (ref 15–500)
Eosinophils Relative: 4 %
HCT: 41.1 % (ref 35.0–45.0)
Hemoglobin: 14 g/dL (ref 11.7–15.5)
Lymphs Abs: 1978 cells/uL (ref 850–3900)
MCH: 30.1 pg (ref 27.0–33.0)
MCHC: 34.1 g/dL (ref 32.0–36.0)
MCV: 88.4 fL (ref 80.0–100.0)
MPV: 11.2 fL (ref 7.5–12.5)
Monocytes Relative: 8.2 %
Neutro Abs: 3435 cells/uL (ref 1500–7800)
Neutrophils Relative %: 55.4 %
Platelets: 212 10*3/uL (ref 140–400)
RBC: 4.65 10*6/uL (ref 3.80–5.10)
RDW: 13.1 % (ref 11.0–15.0)
Total Lymphocyte: 31.9 %
WBC: 6.2 10*3/uL (ref 3.8–10.8)

## 2020-04-20 LAB — LIPASE: Lipase: 24 U/L (ref 7–60)

## 2020-04-20 NOTE — Telephone Encounter (Signed)
Carol Escobar from [facility] called with a report on the US Abdomen Complete done today. Questionable Mass Lesion at the Mid left Kidney. Further evaluation with MR with and without contrast recommended to exclude Renal Neoplasm.  Full report in Epic.

## 2020-04-21 LAB — HELICOBACTER PYLORI  SPECIAL ANTIGEN
MICRO NUMBER:: 10648747
SPECIMEN QUALITY: ADEQUATE

## 2020-04-21 NOTE — Telephone Encounter (Signed)
Attempted to call patient.  No answer.  Left message on machine.

## 2020-04-22 ENCOUNTER — Other Ambulatory Visit (INDEPENDENT_AMBULATORY_CARE_PROVIDER_SITE_OTHER): Payer: Self-pay | Admitting: Gastroenterology

## 2020-04-22 DIAGNOSIS — R7989 Other specified abnormal findings of blood chemistry: Secondary | ICD-10-CM

## 2020-04-22 DIAGNOSIS — R935 Abnormal findings on diagnostic imaging of other abdominal regions, including retroperitoneum: Secondary | ICD-10-CM

## 2020-04-22 NOTE — Telephone Encounter (Signed)
Discussed w/ patient. All questions answered.

## 2020-04-22 NOTE — Progress Notes (Signed)
MR imaging with and without contrast

## 2020-05-03 NOTE — Progress Notes (Signed)
MRI sch'd 05/27/20 800 am (730), npo after midnight, left detailed message for patient

## 2020-05-27 ENCOUNTER — Ambulatory Visit (HOSPITAL_COMMUNITY): Payer: Medicaid Other

## 2020-05-27 ENCOUNTER — Ambulatory Visit (INDEPENDENT_AMBULATORY_CARE_PROVIDER_SITE_OTHER): Payer: Medicaid Other | Admitting: Gastroenterology

## 2020-05-28 ENCOUNTER — Ambulatory Visit (HOSPITAL_COMMUNITY): Payer: Medicaid Other

## 2020-06-07 ENCOUNTER — Other Ambulatory Visit: Payer: Medicaid Other | Admitting: Women's Health

## 2020-06-08 ENCOUNTER — Ambulatory Visit: Payer: Medicaid Other | Admitting: *Deleted

## 2020-06-10 ENCOUNTER — Telehealth (HOSPITAL_COMMUNITY): Payer: Self-pay

## 2020-06-11 ENCOUNTER — Ambulatory Visit (HOSPITAL_COMMUNITY): Payer: Medicaid Other

## 2020-07-05 ENCOUNTER — Other Ambulatory Visit: Payer: Medicaid Other | Admitting: Adult Health

## 2020-07-15 ENCOUNTER — Ambulatory Visit (INDEPENDENT_AMBULATORY_CARE_PROVIDER_SITE_OTHER): Payer: Medicaid Other | Admitting: Gastroenterology

## 2020-08-02 ENCOUNTER — Ambulatory Visit (INDEPENDENT_AMBULATORY_CARE_PROVIDER_SITE_OTHER): Payer: Medicaid Other | Admitting: Gastroenterology

## 2020-08-02 ENCOUNTER — Telehealth (INDEPENDENT_AMBULATORY_CARE_PROVIDER_SITE_OTHER): Payer: Self-pay

## 2020-08-02 NOTE — Telephone Encounter (Signed)
noted 

## 2020-08-02 NOTE — Telephone Encounter (Signed)
Patient no showed for her appoinment today 08/02/2020. I had spoke with the patient and she did confirm she would be here. See below message.   I spoke with the patient she states she will be here at the date and time of the appointment and if for any reason she is unable to make it she will call the office to reschedule.

## 2020-10-26 ENCOUNTER — Ambulatory Visit: Payer: Medicaid Other | Admitting: Internal Medicine

## 2020-10-26 NOTE — Progress Notes (Deleted)
Patient ID: Carol Escobar, female   DOB: 1986-04-16, 35 y.o.   MRN: [id number]   This visit occurred during the SARS-CoV-2 public health emergency.  Safety protocols were in place, including screening questions prior to the visit, additional usage of staff PPE, and extensive cleaning of exam room while observing appropriate contact time as indicated for disinfecting solutions.   HPI: Carol Escobar is a 35 y.o.-year-old female, referred by her PCP, Dr. Laural Golden, for evaluation for endocrine causes for obesity (specifically, r/o Cushing syndrome). Of note, patient was previously seen by Dr. Dorris Fetch (reviewed note from 02/2020) and also by Dr. Gabriel Carina (reviewed note from 05/2020), each ruling out Cushing syndrome.  Patient c/o: - weight gain - fatigue  No previous history of chronic steroid use.  Reviewed previous investigation: 03/16/2016:   05/18/2020: Dexamethasone suppression test: Cortisol 1.4, ACTH undetectable A.m. cortisol 11.9 (6.7-22.6) Estradiol 65.6 DHEA 227 (31-701) Total testosterone ([facility]) 68 (10-55) LH/FSH 6.3/5.3 Prolactin 11.7 (4.8-23.3)  She does have a history of irregular menstrual cycles.  She also has a history of DM2.  Reviewed HbA1c levels: 05/10/2020: HbA1c 8.2% Lab Results  Component Value Date   HGBA1C 6.3 12/04/2019    She declines Metformin due to family history of stomach cancer.  Dr. Gabriel Carina tried to obtain Ozempic for her.  She was able to start Trulicity.  She checks her sugars 0 to once a day - am: - 2h after b'fast: - lunch: - 2h after lunch: - dinner: - 2h after dinner: - bedtime:  Pt's meals are: - Breakfast: - Lunch: - Dinner: - Snacks:  - No h/o hypothyroidism. Last thyroid tests: Lab Results  Component Value Date   TSH 1.09 03/10/2016   -+ HL; last set of lipids: Lab Results  Component Value Date   CHOL 255 (A) 12/04/2019   HDL 49 12/04/2019   LDLCALC 153 12/04/2019   TRIG 290 (A) 12/04/2019  She is on Crestor 20 mg  daily.  Pt. also has a history of fatty liver with elevated AST and ALT on the latest CMP from 05/10/2020.  She also has GERD, anxiety.  Pt has FH of obesity in *** and of DM in ***.   ROS: Constitutional: no weight gain, no weight loss, no fatigue, no subjective hyperthermia, no subjective hypothermia, no nocturia Eyes: no blurry vision, no xerophthalmia ENT: no sore throat, no nodules palpated in neck, no dysphagia, no odynophagia, no hoarseness, no tinnitus, no hypoacusis Cardiovascular: no CP, no SOB, no palpitations, no leg swelling Respiratory: no cough, no SOB, no wheezing Gastrointestinal: no N, no V, no D, no C, no acid reflux Musculoskeletal: no muscle, no joint aches Skin: no rash, no hair loss Neurological: no tremors, no numbness or tingling/no dizziness/no HAs Psychiatric: no depression, no anxiety  PE: LMP 03/05/2020 Comment: Patient states that her periods are irregular Wt Readings from Last 3 Encounters:  04/14/20 273 lb (123.8 kg)  03/17/20 275 lb 12.8 oz (125.1 kg)  05/04/16 260 lb (117.9 kg)   Constitutional: overweight, in NAD Eyes: PERRLA, EOMI, no exophthalmos ENT: moist mucous membranes, no thyromegaly, no cervical lymphadenopathy Cardiovascular: RRR, No MRG Respiratory: CTA B Gastrointestinal: abdomen soft, NT, ND, BS+ Musculoskeletal: no deformities, strength intact in all 4 Skin: moist, warm, no rashes Neurological: no tremor with outstretched hands, DTR normal in all 4  ASSESSMENT: 1. Obesity - class III - see below: BMI Classification:  < 18.5 underweight   18.5-24.9 normal weight   25.0-29.9 overweight   30.0-34.9 class  I obesity   35.0-39.9 class II obesity   ? 40.0 class III obesity  2. DM2  3.  Elevated testosterone level  PLAN: 1. Obesity  -We reviewed together previous investigation for Cushing syndrome and it appears that she ruled out by them 24-hour urine cortisol in 2017 and a dexamethasone suppression test in  04/2020. -Hypothyroidism was ruled out by normal TSH -She did have a high total testosterone, which could point towards PCOS.  However, we do not have monthly testosterone level and I plan to check this for her. -She does not POI/premature menopause as her LH and FSH were normal in 04/2020 -No hyperprolactinemia, since prolactin was normal at the same lab check -No pregnancy since she had a tubal ligation We discussed that we rule out possible endocrine causes for her obesity. -As of now, I would recommend dietary changes and referral to the weight management clinic -In the meantime, I gave her several recommendations towards a healthier diet including a whole food plant-based diet.  The next best diet, in case she cannot follow the exclusive plant-based diet, is a Mediterranean diet.  Ultimately, if this is also not possible for her to follow, weight watchers or Noom would also be good options.  2. DM2 -She has a history of diabetes without known,.  Most recent HbA1c from 5 months ago was 8.2%, elevated.  At today's visit, we repeated her HbA1c: *** -She continues on Trulicity, which she tolerates -She refused Metformin due to family history colon cancer, we discussed that Metformin is not known to be advanced the risk for this type of cancer -Losing weight will definitely help her insulin resistance and diabetes control -We discussed about the importance of checking blood sugars once a day, rotating check times - advised her for yearly eye exams >> she is UTD - return to clinic in 4 months  3.  Elevated testosterone level -She had an elevated total testosterone, with has been this can be in the setting of a high SHBG; her LH and FSH do not completely point towards PCOS (ratio of 2-3/1) -At today's visit we will check a free testosterone level -Weight loss is still the main recommendation if she turns out to have PCOS -We discussed about the syndrome as a sample of several conditions  including weight gain or difficulty losing weight, irregular menstrual cycles, decreased fertility, acne/hirsutism, and polycystic ovaries on ultrasound -OCPs are first-line treatment -Metformin would also help since her main complaint is related to increased weight -I will be in touch with her after the above results  - Discussed diet in detail, reviewing each of her food choices and advised for alternatives (also given a list of healthy substitutions, the patient instructions section). Given specific examples about healthy breakfast, lunch, dinner and snack choices. - Advised to stop drinking sodas of any kind. Substitute with fresh fruit-flavored water. - Advised to eat low glycemic index foods. Avoid highly-processed sugars.  - Discussed at length about the benefits of a diet with less meat, with probably the best being a vegan one, but the Mediterranean diet being the next best one. Given instructions about materials to use for the plant-based diet (please see patient instructions) - Discussed about benefits and side effects of gastric bypass and weight loss medicines. I advised her that until she can change the way she thinks about food and make this a way of life rather than a diet, it would be very difficult to lose weight and especially maintain it down -  Discussed about exercise and importance of getting any level of activity during the day, even walking. - Advised to get My Fitness Pal application, as pt was wondering about the number of calories may need to use to lose weight  Pt instructions:  - Please review the following diet suggestions and look at the recommended reading list - Do not drink your calories (sodas of any kind, sweet tea, or juice). Substitute with fresh fruit-flavored water. - Eat low glycemic index foods. Avoid highly-processed sugars.  - Eat low calorie density foods. Animal products, nuts, oils - have high caloric density. - I suggest a whole food plant-based diet  (please see patient instructions).  Next best thing is probably a Mediterranean diet. - Exercise - any level of activity during the day, even walking.  Aim to build up to 30 minutes at least 5 days a week. - Go to bed early, if you can - Try not to eat when it's dark outside  Please consider the following ways to cut down carbs and fat and increase fiber and micronutrients in your diet: - substitute whole grain for white bread or pasta - substitute brown rice for white rice - substitute 90-calorie flat bread pieces for slices of bread when possible - substitute sweet potatoes or yams for white potatoes - substitute humus for margarine - substitute tofu for cheese when possible - substitute almond or rice milk for regular milk (would not drink soy milk daily due to concern for soy estrogen influence on breast cancer risk) - substitute dark chocolate for other sweets when possible - substitute water - can add lemon or orange slices for taste - for diet sodas (artificial sweeteners will trick your body that you can eat sweets without getting calories and will lead you to overeating and weight gain in the long run) - do not skip breakfast or other meals (this will slow down the metabolism and will result in more weight gain over time)  - can try smoothies made from fruit and almond/rice milk in am instead of regular breakfast - can also try old-fashioned (not instant) oatmeal made with almond/rice milk in am - order the dressing on the side when eating salad at a restaurant (pour less than half of the dressing on the salad) - eat as little meat as possible - can try juicing, but should not forget that juicing will get rid of the fiber, so would alternate with eating raw veg./fruits or drinking smoothies - use as little oil as possible, even when using olive oil - can dress a salad with a mix of balsamic vinegar and lemon juice, for e.g. - use agave nectar, stevia sugar, or regular sugar rather than  artificial sweateners - steam or broil/roast veggies  - snack on veggies/fruit/nuts (unsalted, preferably) when possible, rather than processed foods - reduce or eliminate aspartame in diet (it is in diet sodas, chewing gum, etc) Read the labels!   Plant-based diet materials:  - Lectures (you tube):  Dr. Lequita Asal: Breaking the Food Seduction  Dr. Chip Boer: How to Lose Weight, without Losing Your Mind  Dr. Casimiro Needle Greger: How Not To Die: The Role of Diet in Preventing, Arresting, and Reversing Our Top 15 Killers   - Documentaries:  Supersize Me  Forks over [employer]  What the Health  [employer]  - Facebook pages:   [employer] versus [employer]  Nutrition facts  [employer] Magazine  - Books:  Juanetta Beets - The Engine 2  diet  Dr. Wylene Simmer - The Obesity Code  Dr. Gerri Spore - How Not to Die  Carlus Pavlov, MD PhD [facility] Endocrinology

## 2020-12-13 ENCOUNTER — Telehealth (INDEPENDENT_AMBULATORY_CARE_PROVIDER_SITE_OTHER): Payer: Self-pay

## 2020-12-13 ENCOUNTER — Other Ambulatory Visit (INDEPENDENT_AMBULATORY_CARE_PROVIDER_SITE_OTHER): Payer: Self-pay

## 2020-12-13 ENCOUNTER — Encounter (INDEPENDENT_AMBULATORY_CARE_PROVIDER_SITE_OTHER): Payer: Self-pay

## 2020-12-13 ENCOUNTER — Encounter (INDEPENDENT_AMBULATORY_CARE_PROVIDER_SITE_OTHER): Payer: Self-pay | Admitting: Gastroenterology

## 2020-12-13 ENCOUNTER — Ambulatory Visit (INDEPENDENT_AMBULATORY_CARE_PROVIDER_SITE_OTHER): Payer: Medicaid Other | Admitting: Gastroenterology

## 2020-12-13 ENCOUNTER — Other Ambulatory Visit: Payer: Self-pay

## 2020-12-13 DIAGNOSIS — Z1211 Encounter for screening for malignant neoplasm of colon: Secondary | ICD-10-CM

## 2020-12-13 DIAGNOSIS — R109 Unspecified abdominal pain: Secondary | ICD-10-CM | POA: Insufficient documentation

## 2020-12-13 DIAGNOSIS — R933 Abnormal findings on diagnostic imaging of other parts of digestive tract: Secondary | ICD-10-CM | POA: Insufficient documentation

## 2020-12-13 DIAGNOSIS — K625 Hemorrhage of anus and rectum: Secondary | ICD-10-CM

## 2020-12-13 DIAGNOSIS — R1084 Generalized abdominal pain: Secondary | ICD-10-CM | POA: Diagnosis not present

## 2020-12-13 DIAGNOSIS — R198 Other specified symptoms and signs involving the digestive system and abdomen: Secondary | ICD-10-CM | POA: Diagnosis not present

## 2020-12-13 MED ORDER — DICYCLOMINE HCL 20 MG PO TABS: 20.0000 mg | ORAL_TABLET | Freq: Two times a day (BID) | ORAL | 1 refills | Status: DC | PRN

## 2020-12-13 MED ORDER — PEG 3350-KCL-NA BICARB-NACL 420 G PO SOLR: 4000.0000 mL | ORAL | 0 refills | Status: DC

## 2020-12-13 NOTE — Progress Notes (Signed)
Maylon Peppers, M.D. Gastroenterology & Hepatology [facility] [address] [place], [place] [address]  Primary Care Physician: Manon Hilding, MD [place] [address]  I will communicate my assessment and recommendations to the referring MD via EMR.  Problems: 1. IBS-M 2. Rectal bleeding  History of Present Illness: Carol Escobar is a 35 y.o. female with PMH DM, GERD, IBS-M and anxiety, who presents for follow up of abdominal pain, rectal bleeding and episodes of diarrhea and constipation  The patient was last seen on 04/14/2020. At that time, the patient was ordered to have H. pylori checked which was negative, other labs on 04/20/2020 showed CMP with ALT of 130, AST 79, normal lipase 24, total bilirubin 0.8, alkaline phosphatase normal 44, normal electrolytes and renal function, normal CBC.Abdominal ultrasound performed on 04/20/2020 showed questionable mild lesion at the left kidney versus prominent column of Bertin, possible fatty infiltration in the liver.  Patient was ordered to have an MRCP but she did not perform this.  Due to her elevated liver enzymes she had blood testing ordered but she did not perform this.  Patient reports that for the last two years she has presented episodes of postprandial abdominal pain intermittently. She describes the symptoms suicha as constant burning sensation in the upper abdomen, along with abdominal cramping in the lower abdomen. She used to identify that spicy food triggered her symptoms, but now she has noticed milk triggers her symptoms - she is now on lactose free milk but still is presenting episodes of abdominal pain. States that she used to have symptoms possibly once a month but now is happening weekly which has made her concerned.  She also reports that couple a years ago she has noted fluctuation of her bowel movement freqnecy ranging from predominantly constipation with some  episodes of watery diarrhea . She reports however for the last couple weeks she was having multiple episodes of watery diarrhea at least 10 times a day. Last week she presented some episodes of bloody diarrhea for a couple of days for which he decided to go to [facility] on 12/08/2020. During this hospitalization, underwent blood testing that showed a normal CMP including aminotransferases, CBC showed leukocytosis of 12,000 with normal platelets of 332 and hemoglobin of 15.7.  Had negative testing for respiratory viruses.  Urinalysis was also normal.  Underwent a CT of the abdomen and pelvis with IV contrast which showed an irregular thickening and edema in the cecum but the etiology of this finding was difficult to determine as the patient did not take any oral contrast at that time.  There was also presence of a small volume of free fluid in the pelvis and left colonic diverticulosis. Marland Kitchen She was discharged on ciprofloxacin and Flagyl (10 day course, has not finished it yet) for the diagnosis of colitis and dicyclomine which has improved the diarrhea and abdominal pain. Patient reports feeling nauseated frequently but denies vomiting.  Patient reports that since August she has lost 40 lg after changing dietary habits and starting Trulicity. Also since she lost weight her heartburn has improved, not taking Protonix any more.  The patient denies having any vomiting, fever, chills, hematochezia, melena, hematemesis, abdominal distention,  diarrhea, jaundice, pruritus.  Last EGD: never Last Colonoscopy: never  FHx: neg for any gastrointestinal/liver disease, grandmother renal cancer, aunt pancreatic cancer Social: smokes 1 pack a day, neg alcohol or illicit drug use Surgical: c-sections  Past Medical History:  Past Medical History:  Diagnosis Date  . Anxiety   . GERD (gastroesophageal reflux disease)     Past Surgical History: Past Surgical History:  Procedure Laterality Date  . CESAREAN SECTION     . TUBAL LIGATION      Family History: Family History  Problem Relation Age of Onset  . Diabetes Mother   . Thyroid disease Mother   . Hyperlipidemia Mother   . Heart attack Mother   . Diabetes Father   . Hyperlipidemia Father   . Heart attack Father   . Heart failure Father     Social History: Social History   Tobacco Use  Smoking Status Current Every Day Smoker  . Types: Cigarettes  . Last attempt to quit: 05/05/2015  . Years since quitting: 5.6  Smokeless Tobacco Never Used   Social History   Substance and Sexual Activity  Alcohol Use Never  . Alcohol/week: 0.0 standard drinks   Social History   Substance and Sexual Activity  Drug Use Never    Allergies: Allergies  Allergen Reactions  . Codeine     Medications: Current Outpatient Medications  Medication Sig Dispense Refill  . ciprofloxacin (CIPRO) 500 MG tablet Take 500 mg by mouth 2 (two) times daily.    Marland Kitchen dicyclomine (BENTYL) 20 MG tablet Take 20 mg by mouth 2 (two) times daily.    . metroNIDAZOLE (FLAGYL) 500 MG tablet Take 500 mg by mouth 3 (three) times daily.    . TRULICITY 3 HG/9.9ME SOPN Inject 3 mg into the skin once a week.    . pantoprazole (PROTONIX) 40 MG tablet Take 1 tablet (40 mg total) by mouth daily. (Patient not taking: Reported on 12/13/2020) 30 tablet 3   No current facility-administered medications for this visit.    Review of Systems: GENERAL: negative for malaise, night sweats HEENT: No changes in hearing or vision, no nose bleeds or other nasal problems. NECK: Negative for lumps, goiter, pain and significant neck swelling RESPIRATORY: Negative for cough, wheezing CARDIOVASCULAR: Negative for chest pain, leg swelling, palpitations, orthopnea GI: SEE HPI MUSCULOSKELETAL: Negative for joint pain or swelling, back pain, and muscle pain. SKIN: Negative for lesions, rash PSYCH: Negative for sleep disturbance, mood disorder and recent psychosocial stressors. HEMATOLOGY Negative  for prolonged bleeding, bruising easily, and swollen nodes. ENDOCRINE: Negative for cold or heat intolerance, polyuria, polydipsia and goiter. NEURO: negative for tremor, gait imbalance, syncope and seizures. The remainder of the review of systems is noncontributory.   Physical Exam: BP 125/85 (BP Location: Left Arm, Patient Position: Sitting, Cuff Size: Large)   Pulse 88   Temp (!) 97.5 F (36.4 C) (Oral)   Ht 5' 5.5" (1.664 m)   Wt 236 lb 1.9 oz (107.1 kg)   LMP 11/23/2020 (Approximate) Comment: Patient states that her periods are irregular  Breastfeeding Unknown   BMI 38.69 kg/m  GENERAL: The patient is AO x3, in no acute distress. HEENT: Head is normocephalic and atraumatic. EOMI are intact. Mouth is well hydrated and without lesions. NECK: Supple. No masses LUNGS: Clear to auscultation. No presence of rhonchi/wheezing/rales. Adequate chest expansion HEART: RRR, normal s1 and s2. ABDOMEN: Soft, nontender, no guarding, no peritoneal signs, and nondistended. BS +. No masses. EXTREMITIES: Without any cyanosis, clubbing, rash, lesions or edema. NEUROLOGIC: AOx3, no focal motor deficit. SKIN: no jaundice, no rashes  Imaging/Labs: as above  I personally reviewed and interpreted the available labs, imaging and endoscopic files.  Impression and Plan: Carol Escobar is a 35 y.o.  female with PMH DM, GERD, IBS-M and anxiety, who presents for follow up of abdominal pain, rectal bleeding and episodes of diarrhea and constipation.  I suspect that most of the symptoms the patient has been presenting are related to irritable bowel syndrome, mixed type for which I counseled the patient about the importance of taking a low FODMAP diet as these may provide improvement of her symptoms.  Nevertheless, the patient has presented episodes of rectal bleeding, for which an evaluation with a colonoscopy is warranted at this time.  We will also rule out organic causes with an EGD.  She would benefit from  using Bentyl 10 mg every 12 hours as needed as this is provided some decrease in abdominal pain.  She can finish her antibiotic course as she has almost completed it, since she may have had transient bacterial infection leading to her worsening symptoms recently.  We will also check celiac serologies today. Patient understood and agreed.  - Schedule EGD and colonoscopy - Continue Bentyl 10 mg every 12 hours as needed - Explained presumed etiology of IBS symptoms. Patient was counseled about the benefit of implementing a low FODMAP to improve symptoms and recurrent episodes. A dietary list was provided to the patient. Also, the patient was counseled about the benefit of avoiding stressing situations and potential environmental triggers leading to symptomatology. - Check TTG IgA  All questions were answered.      Harvel Quale, MD Gastroenterology and Hepatology [facility]

## 2020-12-13 NOTE — H&P (View-Only) (Signed)
Maylon Peppers, M.D. Gastroenterology & Hepatology [facility] [address] [place], [place] [address]  Primary Care Physician: Manon Hilding, MD [place] [address]  I will communicate my assessment and recommendations to the referring MD via EMR.  Problems: 1. IBS-M 2. Rectal bleeding  History of Present Illness: ROSAMUND NYLAND is a 35 y.o. female with PMH DM, GERD, IBS-M and anxiety, who presents for follow up of abdominal pain, rectal bleeding and episodes of diarrhea and constipation  The patient was last seen on 04/14/2020. At that time, the patient was ordered to have H. pylori checked which was negative, other labs on 04/20/2020 showed CMP with ALT of 130, AST 79, normal lipase 24, total bilirubin 0.8, alkaline phosphatase normal 44, normal electrolytes and renal function, normal CBC.Abdominal ultrasound performed on 04/20/2020 showed questionable mild lesion at the left kidney versus prominent column of Bertin, possible fatty infiltration in the liver.  Patient was ordered to have an MRCP but she did not perform this.  Due to her elevated liver enzymes she had blood testing ordered but she did not perform this.  Patient reports that for the last two years she has presented episodes of postprandial abdominal pain intermittently. She describes the symptoms suicha as constant burning sensation in the upper abdomen, along with abdominal cramping in the lower abdomen. She used to identify that spicy food triggered her symptoms, but now she has noticed milk triggers her symptoms - she is now on lactose free milk but still is presenting episodes of abdominal pain. States that she used to have symptoms possibly once a month but now is happening weekly which has made her concerned.  She also reports that couple a years ago she has noted fluctuation of her bowel movement freqnecy ranging from predominantly constipation with some  episodes of watery diarrhea . She reports however for the last couple weeks she was having multiple episodes of watery diarrhea at least 10 times a day. Last week she presented some episodes of bloody diarrhea for a couple of days for which he decided to go to [facility] on 12/08/2020. During this hospitalization, underwent blood testing that showed a normal CMP including aminotransferases, CBC showed leukocytosis of 12,000 with normal platelets of 332 and hemoglobin of 15.7.  Had negative testing for respiratory viruses.  Urinalysis was also normal.  Underwent a CT of the abdomen and pelvis with IV contrast which showed an irregular thickening and edema in the cecum but the etiology of this finding was difficult to determine as the patient did not take any oral contrast at that time.  There was also presence of a small volume of free fluid in the pelvis and left colonic diverticulosis. Marland Kitchen She was discharged on ciprofloxacin and Flagyl (10 day course, has not finished it yet) for the diagnosis of colitis and dicyclomine which has improved the diarrhea and abdominal pain. Patient reports feeling nauseated frequently but denies vomiting.  Patient reports that since August she has lost 40 lg after changing dietary habits and starting Trulicity. Also since she lost weight her heartburn has improved, not taking Protonix any more.  The patient denies having any vomiting, fever, chills, hematochezia, melena, hematemesis, abdominal distention,  diarrhea, jaundice, pruritus.  Last EGD: never Last Colonoscopy: never  FHx: neg for any gastrointestinal/liver disease, grandmother renal cancer, aunt pancreatic cancer Social: smokes 1 pack a day, neg alcohol or illicit drug use Surgical: c-sections  Past Medical History:  Past Medical History:  Diagnosis Date  . Anxiety   . GERD (gastroesophageal reflux disease)     Past Surgical History: Past Surgical History:  Procedure Laterality Date  . CESAREAN SECTION     . TUBAL LIGATION      Family History: Family History  Problem Relation Age of Onset  . Diabetes Mother   . Thyroid disease Mother   . Hyperlipidemia Mother   . Heart attack Mother   . Diabetes Father   . Hyperlipidemia Father   . Heart attack Father   . Heart failure Father     Social History: Social History   Tobacco Use  Smoking Status Current Every Day Smoker  . Types: Cigarettes  . Last attempt to quit: 05/05/2015  . Years since quitting: 5.6  Smokeless Tobacco Never Used   Social History   Substance and Sexual Activity  Alcohol Use Never  . Alcohol/week: 0.0 standard drinks   Social History   Substance and Sexual Activity  Drug Use Never    Allergies: Allergies  Allergen Reactions  . Codeine     Medications: Current Outpatient Medications  Medication Sig Dispense Refill  . ciprofloxacin (CIPRO) 500 MG tablet Take 500 mg by mouth 2 (two) times daily.    Marland Kitchen dicyclomine (BENTYL) 20 MG tablet Take 20 mg by mouth 2 (two) times daily.    . metroNIDAZOLE (FLAGYL) 500 MG tablet Take 500 mg by mouth 3 (three) times daily.    . TRULICITY 3 BJ/4.7WG SOPN Inject 3 mg into the skin once a week.    . pantoprazole (PROTONIX) 40 MG tablet Take 1 tablet (40 mg total) by mouth daily. (Patient not taking: Reported on 12/13/2020) 30 tablet 3   No current facility-administered medications for this visit.    Review of Systems: GENERAL: negative for malaise, night sweats HEENT: No changes in hearing or vision, no nose bleeds or other nasal problems. NECK: Negative for lumps, goiter, pain and significant neck swelling RESPIRATORY: Negative for cough, wheezing CARDIOVASCULAR: Negative for chest pain, leg swelling, palpitations, orthopnea GI: SEE HPI MUSCULOSKELETAL: Negative for joint pain or swelling, back pain, and muscle pain. SKIN: Negative for lesions, rash PSYCH: Negative for sleep disturbance, mood disorder and recent psychosocial stressors. HEMATOLOGY Negative  for prolonged bleeding, bruising easily, and swollen nodes. ENDOCRINE: Negative for cold or heat intolerance, polyuria, polydipsia and goiter. NEURO: negative for tremor, gait imbalance, syncope and seizures. The remainder of the review of systems is noncontributory.   Physical Exam: BP 125/85 (BP Location: Left Arm, Patient Position: Sitting, Cuff Size: Large)   Pulse 88   Temp (!) 97.5 F (36.4 C) (Oral)   Ht 5' 5.5" (1.664 m)   Wt 236 lb 1.9 oz (107.1 kg)   LMP 11/23/2020 (Approximate) Comment: Patient states that her periods are irregular  Breastfeeding Unknown   BMI 38.69 kg/m  GENERAL: The patient is AO x3, in no acute distress. HEENT: Head is normocephalic and atraumatic. EOMI are intact. Mouth is well hydrated and without lesions. NECK: Supple. No masses LUNGS: Clear to auscultation. No presence of rhonchi/wheezing/rales. Adequate chest expansion HEART: RRR, normal s1 and s2. ABDOMEN: Soft, nontender, no guarding, no peritoneal signs, and nondistended. BS +. No masses. EXTREMITIES: Without any cyanosis, clubbing, rash, lesions or edema. NEUROLOGIC: AOx3, no focal motor deficit. SKIN: no jaundice, no rashes  Imaging/Labs: as above  I personally reviewed and interpreted the available labs, imaging and endoscopic files.  Impression and Plan: LEBA TIBBITTS is a 35 y.o.  female with PMH DM, GERD, IBS-M and anxiety, who presents for follow up of abdominal pain, rectal bleeding and episodes of diarrhea and constipation.  I suspect that most of the symptoms the patient has been presenting are related to irritable bowel syndrome, mixed type for which I counseled the patient about the importance of taking a low FODMAP diet as these may provide improvement of her symptoms.  Nevertheless, the patient has presented episodes of rectal bleeding, for which an evaluation with a colonoscopy is warranted at this time.  We will also rule out organic causes with an EGD.  She would benefit from  using Bentyl 10 mg every 12 hours as needed as this is provided some decrease in abdominal pain.  She can finish her antibiotic course as she has almost completed it, since she may have had transient bacterial infection leading to her worsening symptoms recently.  We will also check celiac serologies today. Patient understood and agreed.  - Schedule EGD and colonoscopy - Continue Bentyl 10 mg every 12 hours as needed - Explained presumed etiology of IBS symptoms. Patient was counseled about the benefit of implementing a low FODMAP to improve symptoms and recurrent episodes. A dietary list was provided to the patient. Also, the patient was counseled about the benefit of avoiding stressing situations and potential environmental triggers leading to symptomatology. - Check TTG IgA  All questions were answered.      Harvel Quale, MD Gastroenterology and Hepatology [facility]

## 2020-12-13 NOTE — Patient Instructions (Signed)
Schedule EGD and colonoscopy Continue Bentyl 10 gm every 12 hours as needed Explained presumed etiology of IBS symptoms. Patient was counseled about the benefit of implementing a low FODMAP to improve symptoms and recurrent episodes. A dietary list was provided to the patient. Also, the patient was counseled about the benefit of avoiding stressing situations and potential environmental triggers leading to symptomatology. Perform blood workup Finish antibiotic course

## 2020-12-13 NOTE — Telephone Encounter (Signed)
LeighAnn Rashea Hoskie, CMA  

## 2020-12-27 ENCOUNTER — Other Ambulatory Visit: Payer: Self-pay

## 2020-12-27 ENCOUNTER — Other Ambulatory Visit (HOSPITAL_COMMUNITY)
Admission: RE | Admit: 2020-12-27 | Discharge: 2020-12-27 | Disposition: A | Discharge status: Home / Self Care | Payer: Medicaid Other | Source: Ambulatory Visit | Attending: Gastroenterology | Admitting: Gastroenterology

## 2020-12-27 DIAGNOSIS — Z01812 Encounter for preprocedural laboratory examination: Secondary | ICD-10-CM | POA: Insufficient documentation

## 2020-12-27 DIAGNOSIS — Z20822 Contact with and (suspected) exposure to covid-19: Secondary | ICD-10-CM | POA: Diagnosis not present

## 2020-12-27 LAB — SARS CORONAVIRUS 2 (TAT 6-24 HRS): SARS Coronavirus 2: NEGATIVE

## 2020-12-27 LAB — PREGNANCY, URINE: Preg Test, Ur: NEGATIVE

## 2020-12-28 ENCOUNTER — Other Ambulatory Visit: Payer: Self-pay

## 2020-12-28 ENCOUNTER — Ambulatory Visit (HOSPITAL_COMMUNITY): Payer: Medicaid Other | Admitting: Anesthesiology

## 2020-12-28 ENCOUNTER — Encounter (HOSPITAL_COMMUNITY)
Admission: RE | Disposition: A | Discharge status: Home / Self Care | Payer: Self-pay | Source: Home / Self Care | Attending: Gastroenterology

## 2020-12-28 ENCOUNTER — Ambulatory Visit (HOSPITAL_COMMUNITY)
Admission: RE | Admit: 2020-12-28 | Discharge: 2020-12-28 | Disposition: A | Discharge status: Home / Self Care | Payer: Medicaid Other | Attending: Gastroenterology | Admitting: Gastroenterology

## 2020-12-28 ENCOUNTER — Encounter (HOSPITAL_COMMUNITY): Payer: Self-pay | Admitting: Gastroenterology

## 2020-12-28 DIAGNOSIS — R109 Unspecified abdominal pain: Secondary | ICD-10-CM | POA: Diagnosis not present

## 2020-12-28 DIAGNOSIS — K648 Other hemorrhoids: Secondary | ICD-10-CM | POA: Diagnosis not present

## 2020-12-28 DIAGNOSIS — K5730 Diverticulosis of large intestine without perforation or abscess without bleeding: Secondary | ICD-10-CM | POA: Insufficient documentation

## 2020-12-28 DIAGNOSIS — Z79899 Other long term (current) drug therapy: Secondary | ICD-10-CM | POA: Diagnosis not present

## 2020-12-28 DIAGNOSIS — R197 Diarrhea, unspecified: Secondary | ICD-10-CM | POA: Diagnosis present

## 2020-12-28 DIAGNOSIS — K449 Diaphragmatic hernia without obstruction or gangrene: Secondary | ICD-10-CM | POA: Diagnosis not present

## 2020-12-28 DIAGNOSIS — D122 Benign neoplasm of ascending colon: Secondary | ICD-10-CM | POA: Diagnosis not present

## 2020-12-28 DIAGNOSIS — K625 Hemorrhage of anus and rectum: Secondary | ICD-10-CM

## 2020-12-28 DIAGNOSIS — F17210 Nicotine dependence, cigarettes, uncomplicated: Secondary | ICD-10-CM | POA: Insufficient documentation

## 2020-12-28 DIAGNOSIS — Z792 Long term (current) use of antibiotics: Secondary | ICD-10-CM | POA: Diagnosis not present

## 2020-12-28 DIAGNOSIS — Z885 Allergy status to narcotic agent status: Secondary | ICD-10-CM | POA: Insufficient documentation

## 2020-12-28 DIAGNOSIS — K582 Mixed irritable bowel syndrome: Secondary | ICD-10-CM | POA: Diagnosis not present

## 2020-12-28 HISTORY — PX: BIOPSY: SHX5522

## 2020-12-28 HISTORY — DX: Type 2 diabetes mellitus without complications: E11.9

## 2020-12-28 HISTORY — PX: ESOPHAGOGASTRODUODENOSCOPY (EGD) WITH PROPOFOL: SHX5813

## 2020-12-28 HISTORY — PX: COLONOSCOPY WITH PROPOFOL: SHX5780

## 2020-12-28 HISTORY — PX: POLYPECTOMY: SHX5525

## 2020-12-28 LAB — HM COLONOSCOPY

## 2020-12-28 LAB — GLUCOSE, CAPILLARY: Glucose-Capillary: 90 mg/dL (ref 70–99)

## 2020-12-28 SURGERY — COLONOSCOPY WITH PROPOFOL
Anesthesia: General

## 2020-12-28 MED ORDER — PROPOFOL 10 MG/ML IV BOLUS
INTRAVENOUS | Status: AC
  Filled 2020-12-28: qty 20

## 2020-12-28 MED ORDER — DEXMEDETOMIDINE (PRECEDEX) IN NS 20 MCG/5ML (4 MCG/ML) IV SYRINGE
PREFILLED_SYRINGE | INTRAVENOUS | Status: AC
  Filled 2020-12-28: qty 5

## 2020-12-28 MED ORDER — PROPOFOL 10 MG/ML IV BOLUS
INTRAVENOUS | Status: DC | PRN
  Administered 2020-12-28: 50 mg via INTRAVENOUS

## 2020-12-28 MED ORDER — LACTATED RINGERS IV SOLN: INTRAVENOUS | Status: DC

## 2020-12-28 MED ORDER — DEXMEDETOMIDINE (PRECEDEX) IN NS 20 MCG/5ML (4 MCG/ML) IV SYRINGE
PREFILLED_SYRINGE | INTRAVENOUS | Status: DC | PRN
  Administered 2020-12-28: 20 ug via INTRAVENOUS

## 2020-12-28 MED ORDER — PROPOFOL 500 MG/50ML IV EMUL
INTRAVENOUS | Status: DC | PRN
  Administered 2020-12-28: 200 ug/kg/min via INTRAVENOUS

## 2020-12-28 NOTE — Anesthesia Postprocedure Evaluation (Signed)
Anesthesia Post Note  Patient: Carol Escobar  Procedure(s) Performed: COLONOSCOPY WITH PROPOFOL (N/A ) ESOPHAGOGASTRODUODENOSCOPY (EGD) WITH PROPOFOL (N/A ) BIOPSY POLYPECTOMY  Patient location during evaluation: Endoscopy Anesthesia Type: General Level of consciousness: awake and alert Pain management: pain level controlled Vital Signs Assessment: post-procedure vital signs reviewed and stable Respiratory status: spontaneous breathing, nonlabored ventilation, respiratory function stable and patient connected to nasal cannula oxygen Cardiovascular status: blood pressure returned to baseline and stable Postop Assessment: no apparent nausea or vomiting Anesthetic complications: no   No complications documented.   Last Vitals:  Vitals:   12/28/20 0737 12/28/20 0939  BP: 108/69 110/79  Pulse:  91  Resp: 19 20  Temp: 37 C 36.8 C  SpO2: 97% 96%    Last Pain:  Vitals:   12/28/20 0939  TempSrc: Oral  PainSc: 0-No pain                 Lalita Ebel

## 2020-12-28 NOTE — Interval H&P Note (Signed)
History and Physical Interval Note:  12/28/2020 8:06 AM  Carol Escobar is a 35 y.o. female with PMH DM, GERD, IBS-M and anxiety, who presents for evaluation of abdominal pain, rectal bleeding and episodes of diarrhea and constipation  She reports feeling relatively well and denies having any more episodes of rectal bleeding.  Her abdominal pain has been better control after she has taken dicyclomine.  Still presenting some episodes of constipation and diarrhea.  Celiac serology is pending.  BP 108/69   Temp 98.6 F (37 C) (Oral)   Resp 19   Ht 5\' 6"  (1.676 m)   Wt 106.6 kg   LMP 12/22/2020   SpO2 97%   BMI 37.93 kg/m  GENERAL: The patient is AO x3, in no acute distress. HEENT: Head is normocephalic and atraumatic. EOMI are intact. Mouth is well hydrated and without lesions. NECK: Supple. No masses LUNGS: Clear to auscultation. No presence of rhonchi/wheezing/rales. Adequate chest expansion HEART: RRR, normal s1 and s2. ABDOMEN: Soft, nontender, no guarding, no peritoneal signs, and nondistended. BS +. No masses. EXTREMITIES: Without any cyanosis, clubbing, rash, lesions or edema. NEUROLOGIC: AOx3, no focal motor deficit. SKIN: no jaundice, no rashes   Mario R Mchaffie  has presented today for surgery, with the diagnosis of Rectal Bleeding Abdominal Pain.  The various methods of treatment have been discussed with the patient and family. After consideration of risks, benefits and other options for treatment, the patient has consented to  Procedure(s) with comments: COLONOSCOPY WITH PROPOFOL (N/A) - Am ESOPHAGOGASTRODUODENOSCOPY (EGD) WITH PROPOFOL (N/A) as a surgical intervention.  The patient's history has been reviewed, patient examined, no change in status, stable for surgery.  I have reviewed the patient's chart and labs.  Questions were answered to the patient's satisfaction.     [name]

## 2020-12-28 NOTE — Transfer of Care (Signed)
Immediate Anesthesia Transfer of Care Note  Patient: Carol Escobar  Procedure(s) Performed: COLONOSCOPY WITH PROPOFOL (N/A ) ESOPHAGOGASTRODUODENOSCOPY (EGD) WITH PROPOFOL (N/A ) BIOPSY POLYPECTOMY  Patient Location: PACU  Anesthesia Type:General  Level of Consciousness: awake, alert  and oriented  Airway & Oxygen Therapy: Patient Spontanous Breathing  Post-op Assessment: Report given to RN, Post -op Vital signs reviewed and stable and Patient moving all extremities X 4  Post vital signs: Reviewed and stable  Last Vitals:  Vitals Value Taken Time  BP    Temp    Pulse    Resp    SpO2      Last Pain:  Vitals:   12/28/20 0854  TempSrc:   PainSc: 0-No pain      Patients Stated Pain Goal: 6 (08/08/50 0258)  Complications: No complications documented.

## 2020-12-28 NOTE — Op Note (Signed)
[facility] Patient Name: Carol Escobar Procedure Date: 12/28/2020 8:42 AM MRN: [id number] Date of Birth: 10/19/1986 Attending MD: Maylon Peppers ,  CSN: [id number] Age: 35 Admit Type: Outpatient Procedure:                Upper GI endoscopy Indications:              Abdominal pain, Diarrhea Providers:                Maylon Peppers, Janeece Riggers, RN, Raphael Gibney,                            Technician Referring MD:              Medicines:                Monitored Anesthesia Care Complications:            No immediate complications. Estimated Blood Loss:     Estimated blood loss: none. Procedure:                Pre-Anesthesia Assessment:                           - Prior to the procedure, a History and Physical                            was performed, and patient medications, allergies                            and sensitivities were reviewed. The patient's                            tolerance of previous anesthesia was reviewed.                           - The risks and benefits of the procedure and the                            sedation options and risks were discussed with the                            patient. All questions were answered and informed                            consent was obtained.                           - ASA Grade Assessment: I - A normal, healthy                            patient.                           After obtaining informed consent, the endoscope was                            passed under direct vision. Throughout the  procedure, the patient's blood pressure, pulse, and                            oxygen saturations were monitored continuously. The                            [id number]-[id number] ([id number]) was introduced through the                            mouth, and advanced to the third part of duodenum.                            The upper GI endoscopy was accomplished without                            difficulty.  The patient tolerated the procedure                            well. Scope In: 8:54:20 AM Scope Out: 9:00:27 AM Total Procedure Duration: 0 hours 6 minutes 7 seconds  Findings:      A 2 cm hiatal hernia was present.      The entire examined stomach was normal.      The examined duodenum was normal. Biopsies for histology were taken with       a cold forceps for evaluation of celiac disease. Impression:               - 2 cm hiatal hernia.                           - Normal stomach.                           - Normal examined duodenum. Biopsied. Moderate Sedation:      Per Anesthesia Care Recommendation:           - Discharge patient to home (ambulatory).                           - Resume previous diet.                           - Await pathology results. Procedure Code(s):        --- Professional ---                           [phone], Esophagogastroduodenoscopy, flexible,                            transoral; with biopsy, single or multiple Diagnosis Code(s):        --- Professional ---                           K44.9, Diaphragmatic hernia without obstruction or                            gangrene  R10.9, Unspecified abdominal pain                           R19.7, Diarrhea, unspecified CPT copyright 2019 [employer]. All rights reserved. The codes documented in this report are preliminary and upon coder review may  be revised to meet current compliance requirements. Maylon Peppers, MD Maylon Peppers,  12/28/2020 9:05:01 AM This report has been signed electronically. Number of Addenda: 0

## 2020-12-28 NOTE — Anesthesia Preprocedure Evaluation (Addendum)
Anesthesia Evaluation  Patient identified by MRN, date of birth, ID band Patient awake    Reviewed: Allergy & Precautions, NPO status , Patient's Chart, lab work & pertinent test results  Airway Mallampati: II  TM Distance: >3 FB Neck ROM: Full    Dental  (+) Dental Advisory Given, Chipped,    Pulmonary neg sleep apnea (snoring), Current Smoker and Patient abstained from smoking.,    Pulmonary exam normal breath sounds clear to auscultation       Cardiovascular Exercise Tolerance: Good Normal cardiovascular exam Rhythm:Regular Rate:Normal     Neuro/Psych Anxiety negative neurological ROS     GI/Hepatic GERD  Medicated,  Endo/Other  diabetes, Well Controlled, Type 2, Oral Hypoglycemic Agents  Renal/GU      Musculoskeletal negative musculoskeletal ROS (+)   Abdominal   Peds  Hematology negative hematology ROS (+)   Anesthesia Other Findings   Reproductive/Obstetrics negative OB ROS                            Anesthesia Physical Anesthesia Plan  ASA: II  Anesthesia Plan: General   Post-op Pain Management:    Induction: Intravenous  PONV Risk Score and Plan: Treatment may vary due to age or medical condition  Airway Management Planned: Nasal Cannula and Natural Airway  Additional Equipment:   Intra-op Plan:   Post-operative Plan:   Informed Consent: I have reviewed the patients History and Physical, chart, labs and discussed the procedure including the risks, benefits and alternatives for the proposed anesthesia with the patient or authorized representative who has indicated his/her understanding and acceptance.     Dental advisory given  Plan Discussed with: CRNA and Surgeon  Anesthesia Plan Comments:         Anesthesia Quick Evaluation

## 2020-12-28 NOTE — Discharge Instructions (Signed)
Upper Endoscopy, Adult, Care After This sheet gives you information about how to care for yourself after your procedure. Your health care provider may also give you more specific instructions. If you have problems or questions, contact your health care provider. What can I expect after the procedure? After the procedure, it is common to have:  A sore throat.  Mild stomach pain or discomfort.  Bloating.  Nausea. Follow these instructions at home:  Follow instructions from your health care provider about what to eat or drink after your procedure.  Return to your normal activities as told by your health care provider. Ask your health care provider what activities are safe for you.  Take over-the-counter and prescription medicines only as told by your health care provider.  If you were given a sedative during the procedure, it can affect you for several hours. Do not drive or operate machinery until your health care provider says that it is safe.  Keep all follow-up visits as told by your health care provider. This is important.   Contact a health care provider if you have:  A sore throat that lasts longer than one day.  Trouble swallowing. Get help right away if:  You vomit blood or your vomit looks like coffee grounds.  You have: ? A fever. ? Bloody, black, or tarry stools. ? A severe sore throat or you cannot swallow. ? Difficulty breathing. ? Severe pain in your chest or abdomen. Summary  After the procedure, it is common to have a sore throat, mild stomach discomfort, bloating, and nausea.  If you were given a sedative during the procedure, it can affect you for several hours. Do not drive or operate machinery until your health care provider says that it is safe.  Follow instructions from your health care provider about what to eat or drink after your procedure.  Return to your normal activities as told by your health care provider. This information is not intended to  replace advice given to you by your health care provider. Make sure you discuss any questions you have with your health care provider. Document Revised: 10/07/2019 Document Reviewed: 03/11/2018 Elsevier Patient Education  2021 [place], Adult     A hernia happens when tissue inside your body pushes out through a weak spot in your belly muscles (abdominal wall). This makes a round lump (bulge). The lump may be:  In a scar from surgery that was done in your belly (incisional hernia).  Near your belly button (umbilical hernia).  In your groin (inguinal hernia). Your groin is the area where your leg meets your lower belly (abdomen). This kind of hernia could also be: ? In your scrotum, if you are female. ? In folds of skin around your vagina, if you are female.  In your upper thigh (femoral hernia).  Inside your belly (hiatal hernia). This happens when your stomach slides above the muscle between your belly and your chest (diaphragm). If your hernia is small and it does not cause pain, you may not need treatment. If your hernia is large or it causes pain, you may need surgery. Follow these instructions at home: Activity  Avoid stretching or overusing (straining) the muscles near your hernia. Straining can happen when you: ? Lift something heavy. ? Poop (have a bowel movement).  Do not lift anything that is heavier than 10 lb (4.5 kg), or the limit that you are told, until your doctor says that it is safe.  Use the strength of  your legs when you lift something heavy. Do not use only your back muscles to lift. General instructions  Do these things if told by your doctor so you do not have trouble pooping (constipation): ? Drink enough fluid to keep your pee (urine) pale yellow. ? Eat foods that are high in fiber. These include fresh fruits and vegetables, whole grains, and beans. ? Limit foods that are high in fat and processed sugars. These include foods that are fried or  sweet. ? Take medicine for trouble pooping.  When you cough, try to cough gently.  You may try to push your hernia in by very gently pressing on it when you are lying down. Do not try to force the bulge back in if it will not push in easily.  If you are overweight, work with your doctor to lose weight safely.  Do not use any products that have nicotine or tobacco in them. These include cigarettes and e-cigarettes. If you need help quitting, ask your doctor.  If you will be having surgery (hernia repair), watch your hernia for changes in shape, size, or color. Tell your doctor if you see any changes.  Take over-the-counter and prescription medicines only as told by your doctor.  Keep all follow-up visits as told by your doctor. Contact a doctor if:  You get new pain, swelling, or redness near your hernia.  You poop fewer times in a week than normal.  You have trouble pooping.  You have poop (stool) that is more dry than normal.  You have poop that is harder or larger than normal. Get help right away if:  You have a fever.  You have belly pain that gets worse.  You feel sick to your stomach (nauseous).  You throw up (vomit).  Your hernia cannot be pushed in by very gently pressing on it when you are lying down. Do not try to force the bulge back in if it will not push in easily.  Your hernia: ? Changes in shape or size. ? Changes color. ? Feels hard or it hurts when you touch it. These symptoms may represent a serious problem that is an emergency. Do not wait to see if the symptoms will go away. Get medical help right away. Call your local emergency services (911 in the [place] Summary  A hernia happens when tissue inside your body pushes out through a weak spot in the belly muscles. This creates a bulge.  If your hernia is small and it does not hurt, you may not need treatment. If your hernia is large or it hurts, you may need surgery.  If you will be having surgery,  watch your hernia for changes in shape, size, or color. Tell your doctor about any changes. This information is not intended to replace advice given to you by your health care provider. Make sure you discuss any questions you have with your health care provider. Document Revised: 01/30/2019 Document Reviewed: 07/11/2017 Elsevier Patient Education  2021 [place].   Colonoscopy, Adult, Care After This sheet gives you information about how to care for yourself after your procedure. Your doctor may also give you more specific instructions. If you have problems or questions, call your doctor. What can I expect after the procedure? After the procedure, it is common to have:  A small amount of blood in your poop (stool) for 24 hours.  Some gas.  Mild cramping or bloating in your belly (abdomen). Follow these instructions at home: Eating and  drinking  Drink enough fluid to keep your pee (urine) pale yellow.  Follow instructions from your doctor about what you cannot eat or drink.  Return to your normal diet as told by your doctor. Avoid heavy or fried foods that are hard to digest.   Activity  Rest as told by your doctor.  Do not sit for a long time without moving. Get up to take short walks every 1-2 hours. This is important. Ask for help if you feel weak or unsteady.  Return to your normal activities as told by your doctor. Ask your doctor what activities are safe for you. To help cramping and bloating:  Try walking around.  Put heat on your belly as told by your doctor. Use the heat source that your doctor recommends, such as a moist heat pack or a heating pad. ? Put a towel between your skin and the heat source. ? Leave the heat on for 20-30 minutes. ? Remove the heat if your skin turns bright red. This is very important if you are unable to feel pain, heat, or cold. You may have a greater risk of getting burned.   General instructions  If you were given a medicine to help  you relax (sedative) during your procedure, it can affect you for many hours. Do not drive or use machinery until your doctor says that it is safe.  For the first 24 hours after the procedure: ? Do not sign important documents. ? Do not drink alcohol. ? Do your daily activities more slowly than normal. ? Eat foods that are soft and easy to digest.  Take over-the-counter or prescription medicines only as told by your doctor.  Keep all follow-up visits as told by your doctor. This is important. Contact a doctor if:  You have blood in your poop 2-3 days after the procedure. Get help right away if:  You have more than a small amount of blood in your poop.  You see large clumps of tissue (blood clots) in your poop.  Your belly is swollen.  You feel like you may vomit (nauseous).  You vomit.  You have a fever.  You have belly pain that gets worse, and medicine does not help your pain. Summary  After the procedure, it is common to have a small amount of blood in your poop. You may also have mild cramping and bloating in your belly.  If you were given a medicine to help you relax (sedative) during your procedure, it can affect you for many hours. Do not drive or use machinery until your doctor says that it is safe.  Get help right away if you have a lot of blood in your poop, feel like you may vomit, have a fever, or have more belly pain. This information is not intended to replace advice given to you by your health care provider. Make sure you discuss any questions you have with your health care provider. Document Revised: 08/15/2019 Document Reviewed: 05/05/2019 Elsevier Patient Education  [place].  Colon Polyps  Colon polyps are tissue growths inside the colon, which is part of the large intestine. They are one of the types of polyps that can grow in the body. A polyp may be a round bump or a mushroom-shaped growth. You could have one polyp or more than one. Most  colon polyps are noncancerous (benign). However, some colon polyps can become cancerous over time. Finding and removing the polyps early can help prevent this. What are  the causes? The exact cause of colon polyps is not known. What increases the risk? The following factors may make you more likely to develop this condition:  Having a family history of colorectal cancer or colon polyps.  Being older than 37 years of age.  Being younger than 37 years of age and having a significant family history of colorectal cancer or colon polyps or a genetic condition that puts you at higher risk of getting colon polyps.  Having inflammatory bowel disease, such as ulcerative colitis or Crohn's disease.  Having certain conditions passed from parent to child (hereditary conditions), such as: ? Familial adenomatous polyposis (FAP). ? Lynch syndrome. ? Turcot syndrome. ? Peutz-Jeghers syndrome. ? MUTYH-associated polyposis (MAP).  Being overweight.  Certain lifestyle factors. These include smoking cigarettes, drinking too much alcohol, not getting enough exercise, and eating a diet that is high in fat and red meat and low in fiber.  Having had childhood cancer that was treated with radiation of the abdomen. What are the signs or symptoms? Many times, there are no symptoms. If you have symptoms, they may include:  Blood coming from the rectum during a bowel movement.  Blood in the stool (feces). The blood may be bright red or very dark in color.  Pain in the abdomen.  A change in bowel habits, such as constipation or diarrhea. How is this diagnosed? This condition is diagnosed with a colonoscopy. This is a procedure in which a lighted, flexible scope is inserted into the opening between the buttocks (anus) and then passed into the colon to examine the area. Polyps are sometimes found when a colonoscopy is done as part of routine cancer screening tests. How is this treated? This condition is  treated by removing any polyps that are found. Most polyps can be removed during a colonoscopy. Those polyps will then be tested for cancer. Additional treatment may be needed depending on the results of testing. Follow these instructions at home: Eating and drinking  Eat foods that are high in fiber, such as fruits, vegetables, and whole grains.  Eat foods that are high in calcium and vitamin D, such as milk, cheese, yogurt, eggs, liver, fish, and broccoli.  Limit foods that are high in fat, such as fried foods and desserts.  Limit the amount of red meat, precooked or cured meat, or other processed meat that you eat, such as hot dogs, sausages, bacon, or meat loaves.  Limit sugary drinks.   Lifestyle  Maintain a healthy weight, or lose weight if recommended by your health care provider.  Exercise every day or as told by your health care provider.  Do not use any products that contain nicotine or tobacco, such as cigarettes, e-cigarettes, and chewing tobacco. If you need help quitting, ask your health care provider.  Do not drink alcohol if: ? Your health care provider tells you not to drink. ? You are pregnant, may be pregnant, or are planning to become pregnant.  If you drink alcohol: ? Limit how much you use to:  0-1 drink a day for women.  0-2 drinks a day for men. ? Know how much alcohol is in your drink. In the [place] one drink equals one 12 oz bottle of beer (355 mL), one 5 oz glass of wine (148 mL), or one 1 oz glass of hard liquor (44 mL). General instructions  Take over-the-counter and prescription medicines only as told by your health care provider.  Keep all follow-up visits. This is important. This  includes having regularly scheduled colonoscopies. Talk to your health care provider about when you need a colonoscopy. Contact a health care provider if:  You have new or worsening bleeding during a bowel movement.  You have new or increased blood in your  stool.  You have a change in bowel habits.  You lose weight for no known reason. Summary  Colon polyps are tissue growths inside the colon, which is part of the large intestine. They are one type of polyp that can grow in the body.  Most colon polyps are noncancerous (benign), but some can become cancerous over time.  This condition is diagnosed with a colonoscopy.  This condition is treated by removing any polyps that are found. Most polyps can be removed during a colonoscopy. This information is not intended to replace advice given to you by your health care provider. Make sure you discuss any questions you have with your health care provider. Document Revised: 01/28/2020 Document Reviewed: 01/28/2020 Elsevier Patient Education  2021 [place] are being discharged to home.  Resume your previous diet.  We are waiting for your pathology results.  Your physician has recommended a repeat colonoscopy for surveillance based on pathology results.

## 2020-12-28 NOTE — Op Note (Signed)
[facility] Patient Name: Carol Escobar Procedure Date: 12/28/2020 9:05 AM MRN: [id number] Date of Birth: 22-May-1986 Attending MD: Maylon Peppers ,  CSN: [id number] Age: 35 Admit Type: Outpatient Procedure:                Colonoscopy Indications:              Clinically significant diarrhea of unexplained                            origin, Rectal bleeding Providers:                Maylon Peppers, Janeece Riggers, RN, Raphael Gibney,                            Technician Referring MD:              Medicines:                Monitored Anesthesia Care Complications:            No immediate complications. Estimated Blood Loss:     Estimated blood loss: none. Procedure:                Pre-Anesthesia Assessment:                           - Prior to the procedure, a History and Physical                            was performed, and patient medications, allergies                            and sensitivities were reviewed. The patient's                            tolerance of previous anesthesia was reviewed.                           - The risks and benefits of the procedure and the                            sedation options and risks were discussed with the                            patient. All questions were answered and informed                            consent was obtained.                           - ASA Grade Assessment: I - A normal, healthy                            patient.                           After obtaining informed consent, the colonoscope  was passed under direct vision. Throughout the                            procedure, the patient's blood pressure, pulse, and                            oxygen saturations were monitored continuously. The                            PCF-[id number] ([id number]) was introduced through the                            anus and advanced to the the cecum, identified by                            appendiceal orifice  and ileocecal valve. The                            colonoscopy was performed without difficulty. The                            patient tolerated the procedure well. The quality                            of the bowel preparation was good. Scope withdrawal                            time was 12 minutes. Scope In: 9:08:17 AM Scope Out: 9:32:50 AM Scope Withdrawal Time: 0 hours 13 minutes 36 seconds  Total Procedure Duration: 0 hours 24 minutes 33 seconds  Findings:      The perianal and digital rectal examinations were normal.      A 8 mm polyp was found in the distal ascending colon. The polyp was       flat. Area was successfully injected with 5 mL [place] for a lift       polypectomy. Imaging was performed using white light and narrow band       imaging to visualize the mucosa and demarcate the polyp site after       injection for EMR purposes. The polyp was removed with a cold snare.       Resection and retrieval were complete. Imaging was performed using white       light and narrow band imaging to visualize the mucosa and demarcate the       polyp site after injection for EMR purposes.      Scattered small and large-mouthed diverticula were found in the sigmoid       colon, descending colon, transverse colon and ascending colon. Biopsies       from the normal mucosa were taken for histology with a cold forceps from       the right colon and left colon for evaluation of microscopic colitis.      Non-bleeding internal hemorrhoids were found during retroflexion. The       hemorrhoids were small. Impression:               - One 8 mm polyp in the distal ascending colon,  removed with a cold snare. Injected. Resected and                            retrieved.                           - Diverticulosis in the sigmoid colon, in the                            descending colon, in the transverse colon and in                            the ascending colon.                            - Random biopsies from normal mucosa were taken.                           - Non-bleeding internal hemorrhoids. Moderate Sedation:      Per Anesthesia Care Recommendation:           - Discharge patient to home (ambulatory).                           - Resume previous diet.                           - Await pathology results.                           - Repeat colonoscopy for surveillance based on                            pathology results. Procedure Code(s):        --- Professional ---                           [phone], Colonoscopy, flexible; with removal of                            tumor(s), polyp(s), or other lesion(s) by snare                            technique                           [id number], Colonoscopy, flexible; with directed                            submucosal injection(s), any substance                           82423, 59, Colonoscopy, flexible; with biopsy,                            single or multiple Diagnosis Code(s):        --- Professional ---  K63.5, Polyp of colon                           K64.8, Other hemorrhoids                           R19.7, Diarrhea, unspecified                           K62.5, Hemorrhage of anus and rectum                           K57.30, Diverticulosis of large intestine without                            perforation or abscess without bleeding CPT copyright 2019 American Medical Association. All rights reserved. The codes documented in this report are preliminary and upon coder review may  be revised to meet current compliance requirements. Maylon Peppers, MD Maylon Peppers,  12/28/2020 9:50:10 AM This report has been signed electronically. Number of Addenda: 0

## 2020-12-29 ENCOUNTER — Encounter (INDEPENDENT_AMBULATORY_CARE_PROVIDER_SITE_OTHER): Payer: Self-pay | Admitting: *Deleted

## 2020-12-29 LAB — SURGICAL PATHOLOGY

## 2020-12-31 ENCOUNTER — Encounter (HOSPITAL_COMMUNITY): Payer: Self-pay | Admitting: Gastroenterology

## 2021-03-14 ENCOUNTER — Encounter (INDEPENDENT_AMBULATORY_CARE_PROVIDER_SITE_OTHER): Payer: Self-pay

## 2021-03-14 ENCOUNTER — Other Ambulatory Visit: Payer: Self-pay

## 2021-03-14 ENCOUNTER — Encounter (INDEPENDENT_AMBULATORY_CARE_PROVIDER_SITE_OTHER): Payer: Self-pay | Admitting: Gastroenterology

## 2021-03-14 ENCOUNTER — Ambulatory Visit (INDEPENDENT_AMBULATORY_CARE_PROVIDER_SITE_OTHER): Payer: Medicaid Other | Admitting: Gastroenterology

## 2021-03-14 ENCOUNTER — Telehealth (INDEPENDENT_AMBULATORY_CARE_PROVIDER_SITE_OTHER): Payer: Medicaid Other | Admitting: Gastroenterology

## 2021-03-14 ENCOUNTER — Telehealth (INDEPENDENT_AMBULATORY_CARE_PROVIDER_SITE_OTHER): Payer: Self-pay

## 2021-03-14 DIAGNOSIS — K625 Hemorrhage of anus and rectum: Secondary | ICD-10-CM

## 2021-03-14 NOTE — Progress Notes (Signed)
Patient did not show up for her appointment, encounter was voided. Maylon Peppers, MD Gastroenterology and Hepatology [facility]

## 2021-03-14 NOTE — Telephone Encounter (Signed)
Patient no showed for appointment at [place] with Dr. Maylon Peppers on 03/14/2021. Patient was aware of virtual visit. I tried calling her three times. Left vm asked to call back for appointment. Patient did not return call.

## 2021-03-15 ENCOUNTER — Telehealth (INDEPENDENT_AMBULATORY_CARE_PROVIDER_SITE_OTHER): Payer: Medicaid Other | Admitting: Gastroenterology

## 2021-03-15 NOTE — Telephone Encounter (Signed)
I called and left a detailed message asked that the patient please return call to reschedule.

## 2021-03-16 NOTE — Telephone Encounter (Signed)
Please see if this patient want to be rescheduled. Thanks

## 2021-04-16 ENCOUNTER — Other Ambulatory Visit (INDEPENDENT_AMBULATORY_CARE_PROVIDER_SITE_OTHER): Payer: Self-pay | Admitting: Gastroenterology

## 2021-04-16 DIAGNOSIS — R1084 Generalized abdominal pain: Secondary | ICD-10-CM

## 2021-09-05 IMAGING — US US ABDOMEN COMPLETE
1 series · 13 of 25 positions shown · non-contrast
Comparison: 12/12/2019 RIGHT upper quadrant ultrasound

CLINICAL DATA: Upper abdominal pain, epigastric pain, chronic GERD,
smoker, type II diabetes mellitus, hyperlipidemia

EXAM:
ABDOMEN ULTRASOUND COMPLETE

[Series 1: us abdomen complete · 13 of 112 slices shown]
[im 1/112]
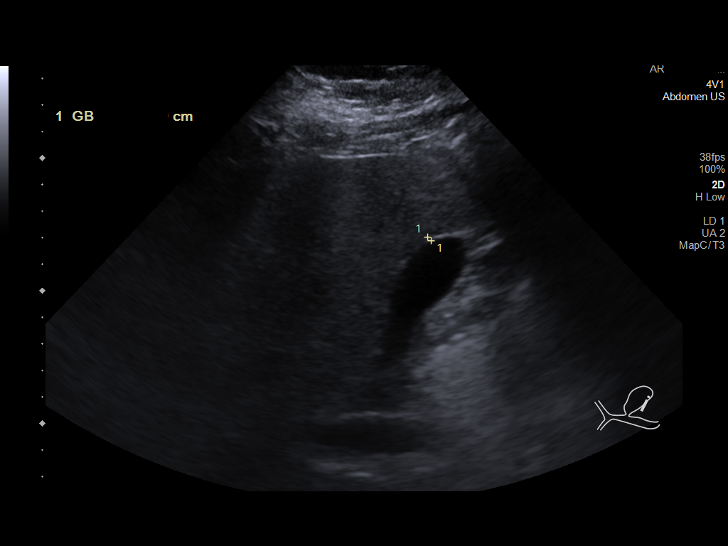
[im 10/112]
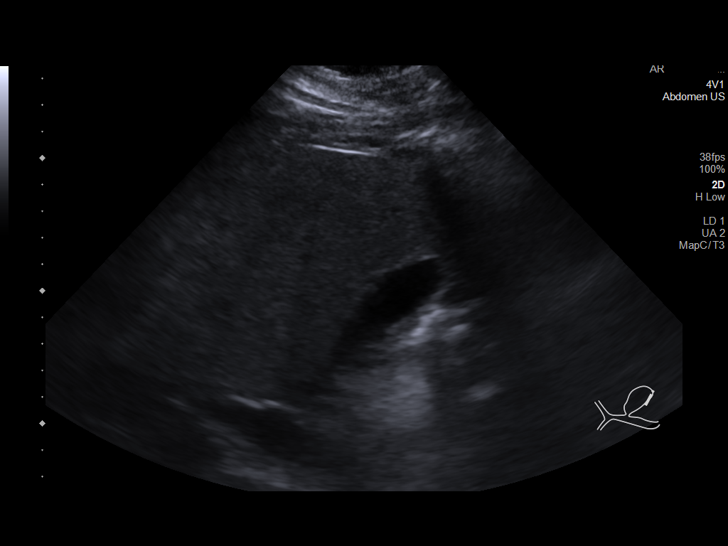
[im 19/112]
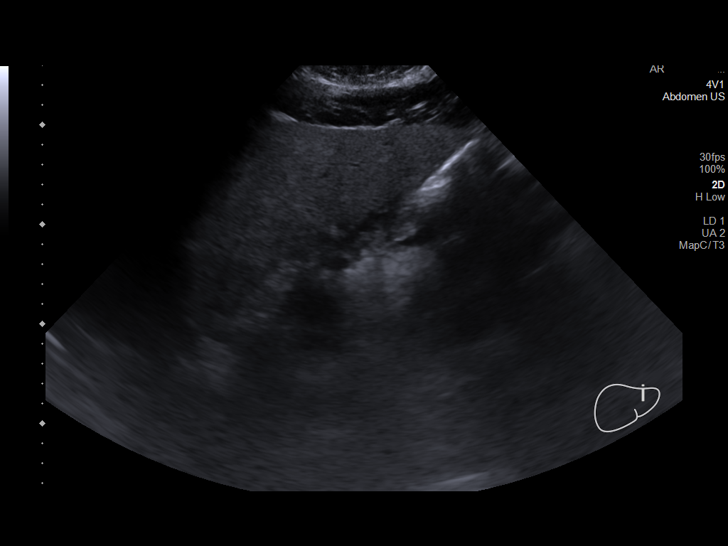
[im 28/112]
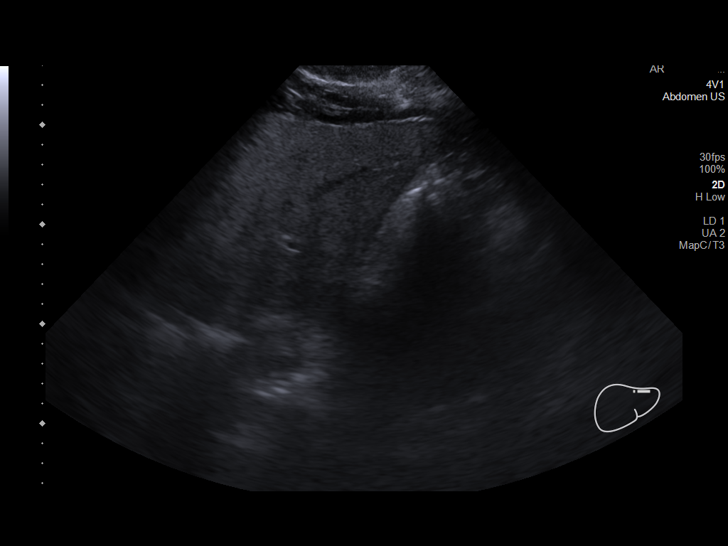
[im 38/112]
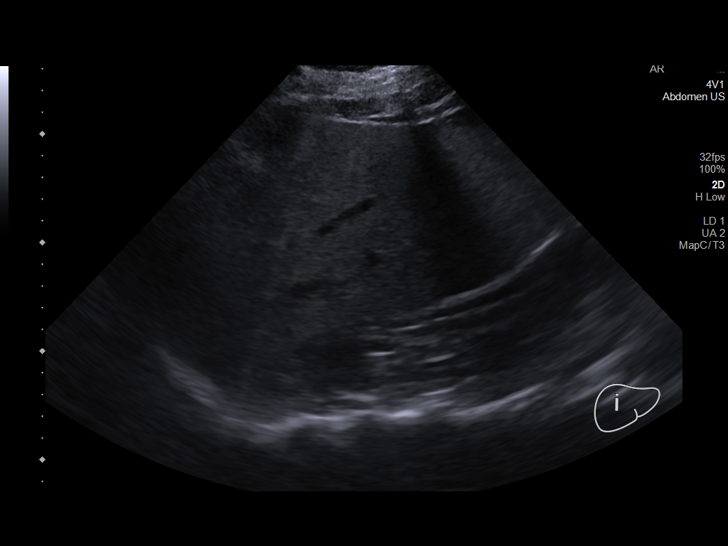
[im 47/112]
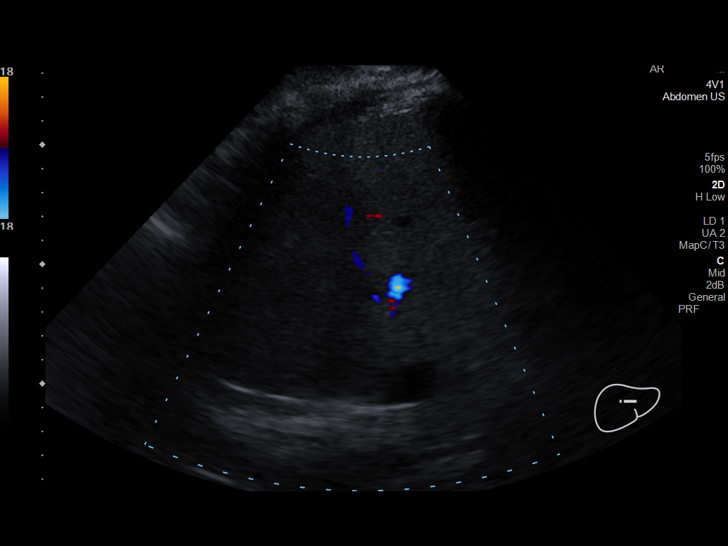
[im 56/112]
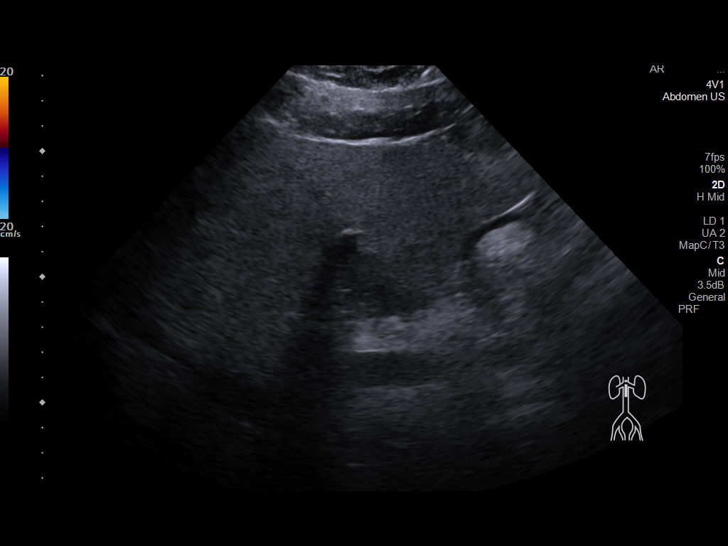
[im 65/112]
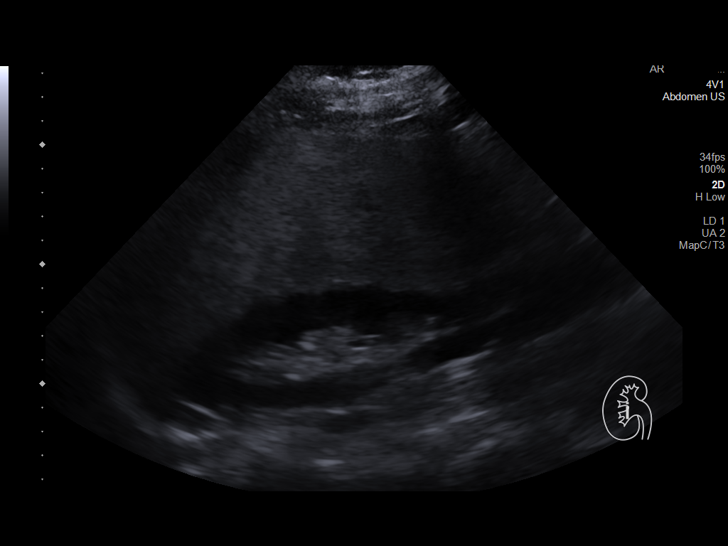
[im 75/112]
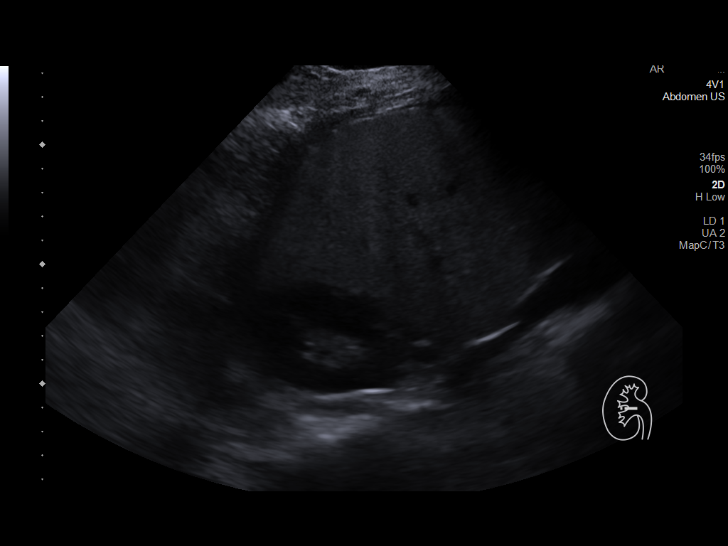
[im 84/112]
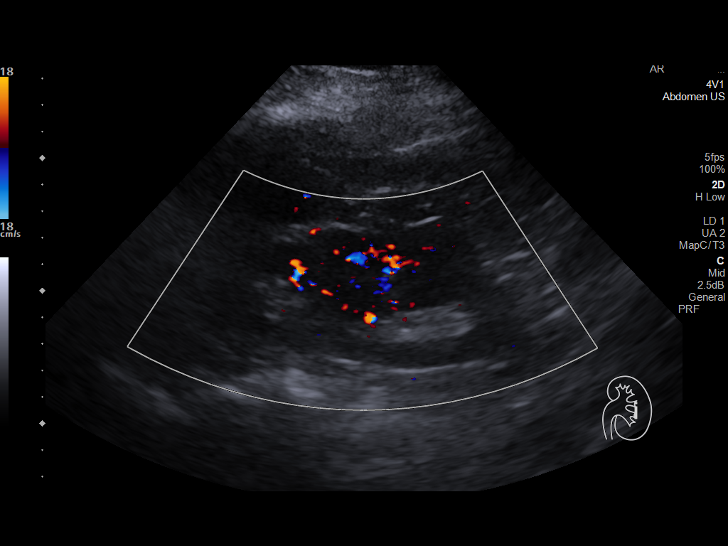
[im 93/112]
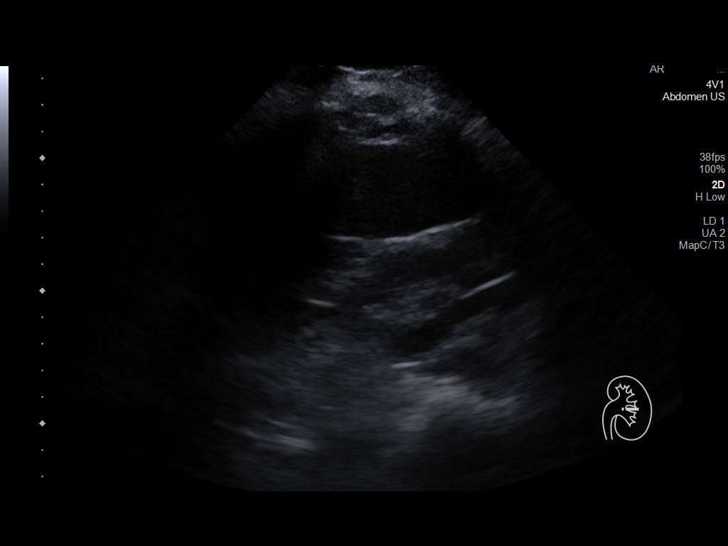
[im 102/112]
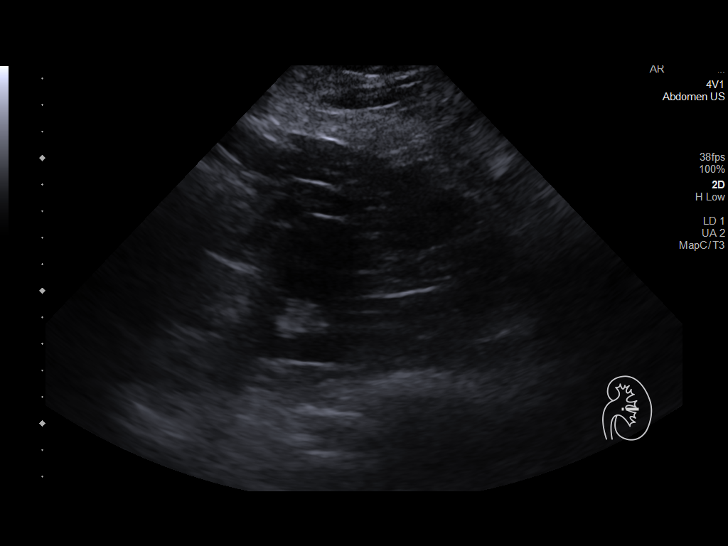
[im 112/112]
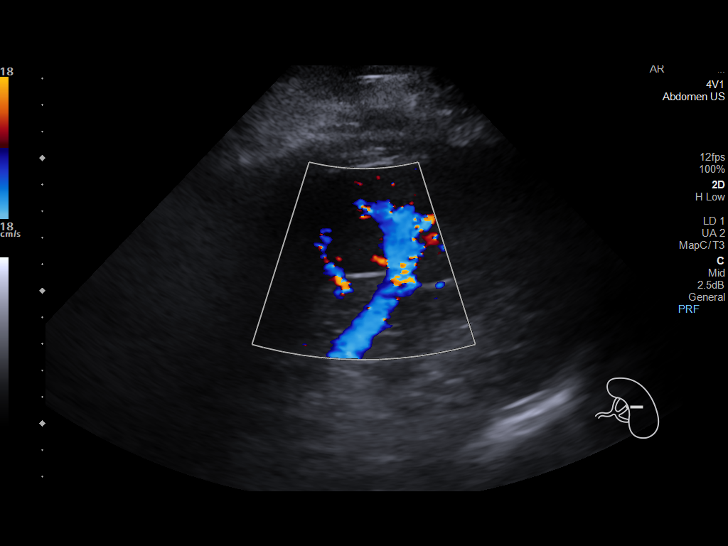

[13 of 25 positions shown; findings below may reference images not displayed]

FINDINGS: Gallbladder: Normally distended without stones or wall thickening.
No pericholecystic fluid or sonographic Murphy sign.

Common bile duct: Diameter: 5 mm, normal

Liver: Echogenic parenchyma, likely fatty infiltration though this
can be seen with cirrhosis and certain infiltrative disorders. No
focal hepatic mass or nodularity. Portal vein is patent on color
Doppler imaging with normal direction of blood flow towards the
liver.

IVC: Normal appearance

Pancreas: Fatty replacement.  No focal abnormality.

Spleen: Normal appearance, 10.1 cm length

Right Kidney: Length: 13.6 cm. Normal morphology without mass or
hydronephrosis.

Left Kidney: Length: 12.9 cm. Normal morphology questionable mass
versus prominent column of Bertin 3.1 x 3.3 x 3.7 cm. No
hydronephrosis.

Abdominal aorta: Normal caliber

Other findings: No free fluid
IMPRESSION: Questionable mass lesion at the mid LEFT kidney versus less likely a
prominent column of Bertin; further evaluation by MR imaging with
and without contrast recommended to exclude renal neoplasm.

Probable fatty infiltration of liver.

No other definite upper abdominal sonographic abnormalities.

These results will be called to the ordering clinician or
representative by the Radiologist Assistant, and communication
documented in the PACS or [REDACTED].

## 2021-09-15 ENCOUNTER — Other Ambulatory Visit (INDEPENDENT_AMBULATORY_CARE_PROVIDER_SITE_OTHER): Payer: Self-pay | Admitting: Gastroenterology

## 2021-09-15 DIAGNOSIS — R1084 Generalized abdominal pain: Secondary | ICD-10-CM

## 2021-09-19 NOTE — Telephone Encounter (Signed)
Last seen 12/13/20

## 2022-04-16 ENCOUNTER — Other Ambulatory Visit (INDEPENDENT_AMBULATORY_CARE_PROVIDER_SITE_OTHER): Payer: Self-pay | Admitting: Gastroenterology

## 2022-04-16 DIAGNOSIS — R1084 Generalized abdominal pain: Secondary | ICD-10-CM

## 2022-12-26 ENCOUNTER — Other Ambulatory Visit (INDEPENDENT_AMBULATORY_CARE_PROVIDER_SITE_OTHER): Payer: Self-pay | Admitting: Gastroenterology

## 2022-12-26 DIAGNOSIS — R1084 Generalized abdominal pain: Secondary | ICD-10-CM
